# Patient Record
Sex: Female | Born: 1980 | Race: Black or African American | Hispanic: No | Smoking: Never smoker
Health system: Southern US, Community
[De-identification: ages and names within clinical notes are randomized; demographics above are authoritative.]

## PROBLEM LIST (undated history)

## (undated) DIAGNOSIS — B019 Varicella without complication: Secondary | ICD-10-CM

## (undated) DIAGNOSIS — O139 Gestational [pregnancy-induced] hypertension without significant proteinuria, unspecified trimester: Secondary | ICD-10-CM

## (undated) DIAGNOSIS — D219 Benign neoplasm of connective and other soft tissue, unspecified: Secondary | ICD-10-CM

## (undated) DIAGNOSIS — R51 Headache: Secondary | ICD-10-CM

## (undated) DIAGNOSIS — E781 Pure hyperglyceridemia: Secondary | ICD-10-CM

## (undated) HISTORY — DX: Benign neoplasm of connective and other soft tissue, unspecified: D21.9

## (undated) HISTORY — DX: Varicella without complication: B01.9

## (undated) HISTORY — DX: Gestational (pregnancy-induced) hypertension without significant proteinuria, unspecified trimester: O13.9

## (undated) HISTORY — DX: Headache: R51

## (undated) HISTORY — DX: Pure hyperglyceridemia: E78.1

---

## 2000-05-24 ENCOUNTER — Encounter: Payer: Self-pay | Admitting: Emergency Medicine

## 2000-05-24 ENCOUNTER — Emergency Department (HOSPITAL_COMMUNITY): Admission: EM | Admit: 2000-05-24 | Discharge: 2000-05-24 | Payer: Self-pay | Admitting: Emergency Medicine

## 2003-06-01 ENCOUNTER — Emergency Department (HOSPITAL_COMMUNITY): Admission: EM | Admit: 2003-06-01 | Discharge: 2003-06-01 | Payer: Self-pay | Admitting: Emergency Medicine

## 2004-06-27 ENCOUNTER — Other Ambulatory Visit: Admission: RE | Admit: 2004-06-27 | Discharge: 2004-06-27 | Payer: Self-pay | Admitting: Obstetrics and Gynecology

## 2005-06-02 ENCOUNTER — Emergency Department (HOSPITAL_COMMUNITY): Admission: EM | Admit: 2005-06-02 | Discharge: 2005-06-02 | Payer: Self-pay | Admitting: Family Medicine

## 2005-06-24 ENCOUNTER — Encounter: Admission: RE | Admit: 2005-06-24 | Discharge: 2005-06-24 | Payer: Self-pay | Admitting: *Deleted

## 2007-02-11 ENCOUNTER — Encounter: Admission: RE | Admit: 2007-02-11 | Discharge: 2007-02-11 | Payer: Self-pay | Admitting: Chiropractic Medicine

## 2009-07-11 ENCOUNTER — Emergency Department (HOSPITAL_COMMUNITY): Admission: EM | Admit: 2009-07-11 | Discharge: 2009-07-11 | Payer: Self-pay | Admitting: Family Medicine

## 2010-07-22 LAB — POCT I-STAT, CHEM 8
BUN: 6 mg/dL (ref 6–23)
Calcium, Ion: 1.12 mmol/L (ref 1.12–1.32)
Chloride: 104 mEq/L (ref 96–112)
Creatinine, Ser: 0.6 mg/dL (ref 0.4–1.2)
Glucose, Bld: 91 mg/dL (ref 70–99)
HCT: 39 % (ref 36.0–46.0)
Hemoglobin: 13.3 g/dL (ref 12.0–15.0)
Potassium: 3.3 mEq/L — ABNORMAL LOW (ref 3.5–5.1)
Sodium: 139 mEq/L (ref 135–145)
TCO2: 27 mmol/L (ref 0–100)

## 2010-07-22 LAB — POCT PREGNANCY, URINE: Preg Test, Ur: NEGATIVE

## 2010-08-12 LAB — HEPATITIS B SURFACE ANTIGEN: Hepatitis B Surface Ag: NEGATIVE

## 2010-08-12 LAB — GC/CHLAMYDIA PROBE AMP, GENITAL
Chlamydia: NEGATIVE
Gonorrhea: NEGATIVE

## 2010-08-12 LAB — HIV ANTIBODY (ROUTINE TESTING W REFLEX): HIV: NONREACTIVE

## 2010-08-12 LAB — ANTIBODY SCREEN: Antibody Screen: NEGATIVE

## 2010-08-12 LAB — ABO/RH: RH Type: POSITIVE

## 2010-08-12 LAB — RUBELLA ANTIBODY, IGM: Rubella: IMMUNE

## 2010-08-12 LAB — RPR: RPR: NONREACTIVE

## 2011-02-27 LAB — STREP B DNA PROBE: GBS: NEGATIVE

## 2011-03-07 ENCOUNTER — Telehealth (HOSPITAL_COMMUNITY): Payer: Self-pay | Admitting: *Deleted

## 2011-03-07 ENCOUNTER — Encounter (HOSPITAL_COMMUNITY): Payer: Self-pay | Admitting: *Deleted

## 2011-03-07 NOTE — Telephone Encounter (Signed)
Preadmission screen  

## 2011-03-13 ENCOUNTER — Ambulatory Visit (HOSPITAL_COMMUNITY): Payer: BC Managed Care – PPO | Admitting: Anesthesiology

## 2011-03-13 ENCOUNTER — Other Ambulatory Visit: Payer: Self-pay | Admitting: Obstetrics and Gynecology

## 2011-03-13 ENCOUNTER — Encounter (HOSPITAL_COMMUNITY): Payer: Self-pay

## 2011-03-13 ENCOUNTER — Encounter (HOSPITAL_COMMUNITY)
Admission: RE | Disposition: A | Discharge status: Home / Self Care | Payer: Self-pay | Source: Ambulatory Visit | Attending: Obstetrics and Gynecology

## 2011-03-13 ENCOUNTER — Encounter (HOSPITAL_COMMUNITY): Payer: Self-pay | Admitting: Anesthesiology

## 2011-03-13 ENCOUNTER — Inpatient Hospital Stay (HOSPITAL_COMMUNITY)
Admission: RE | Admit: 2011-03-13 | Discharge: 2011-03-15 | DRG: 651 | Disposition: A | Discharge status: Home / Self Care | Payer: BC Managed Care – PPO | Source: Ambulatory Visit | Attending: Obstetrics and Gynecology | Admitting: Obstetrics and Gynecology

## 2011-03-13 DIAGNOSIS — O139 Gestational [pregnancy-induced] hypertension without significant proteinuria, unspecified trimester: Principal | ICD-10-CM | POA: Diagnosis present

## 2011-03-13 LAB — CBC
HCT: 30.3 % — ABNORMAL LOW (ref 36.0–46.0)
HCT: 31.1 % — ABNORMAL LOW (ref 36.0–46.0)
Hemoglobin: 9.9 g/dL — ABNORMAL LOW (ref 12.0–15.0)
Hemoglobin: 10.2 g/dL — ABNORMAL LOW (ref 12.0–15.0)
MCH: 30.1 pg (ref 26.0–34.0)
MCH: 30 pg (ref 26.0–34.0)
MCHC: 32.7 g/dL (ref 30.0–36.0)
MCHC: 32.8 g/dL (ref 30.0–36.0)
MCV: 92.1 fL (ref 78.0–100.0)
MCV: 91.5 fL (ref 78.0–100.0)
Platelets: 298 10*3/uL (ref 150–400)
Platelets: 274 10*3/uL (ref 150–400)
RBC: 3.29 MIL/uL — ABNORMAL LOW (ref 3.87–5.11)
RBC: 3.4 MIL/uL — ABNORMAL LOW (ref 3.87–5.11)
RDW: 14.2 % (ref 11.5–15.5)
RDW: 14.1 % (ref 11.5–15.5)
WBC: 9.6 10*3/uL (ref 4.0–10.5)
WBC: 13.9 10*3/uL — ABNORMAL HIGH (ref 4.0–10.5)

## 2011-03-13 LAB — COMPREHENSIVE METABOLIC PANEL
ALT: 10 U/L (ref 0–35)
AST: 15 U/L (ref 0–37)
Albumin: 2.6 g/dL — ABNORMAL LOW (ref 3.5–5.2)
Alkaline Phosphatase: 132 U/L — ABNORMAL HIGH (ref 39–117)
BUN: 7 mg/dL (ref 6–23)
CO2: 23 mEq/L (ref 19–32)
Calcium: 9.7 mg/dL (ref 8.4–10.5)
Chloride: 102 mEq/L (ref 96–112)
Creatinine, Ser: 0.52 mg/dL (ref 0.50–1.10)
GFR calc Af Amer: >90 mL/min (ref >90)
GFR calc non Af Amer: >90 mL/min (ref >90)
Glucose, Bld: 110 mg/dL — ABNORMAL HIGH (ref 70–99)
Potassium: 4 mEq/L (ref 3.5–5.1)
Sodium: 136 mEq/L (ref 135–145)
Total Bilirubin: 0.1 mg/dL — ABNORMAL LOW (ref 0.3–1.2)
Total Protein: 6.3 g/dL (ref 6.0–8.3)

## 2011-03-13 LAB — URIC ACID: Uric Acid, Serum: 4.7 mg/dL (ref 2.4–7.0)

## 2011-03-13 SURGERY — Surgical Case
Anesthesia: Epidural | Site: Abdomen | Wound class: Clean Contaminated

## 2011-03-13 MED ORDER — LACTATED RINGERS IV SOLN: 500.0000 mL | INTRAVENOUS | Status: DC | PRN

## 2011-03-13 MED ORDER — EPHEDRINE 5 MG/ML INJ: 10.0000 mg | INTRAVENOUS | Status: DC | PRN

## 2011-03-13 MED ORDER — CEFAZOLIN SODIUM 1-5 GM-% IV SOLN
INTRAVENOUS | Status: DC | PRN
  Administered 2011-03-13: 2 g via INTRAVENOUS

## 2011-03-13 MED ORDER — HYDROMORPHONE HCL PF 1 MG/ML IJ SOLN: 0.2500 mg | INTRAMUSCULAR | Status: DC | PRN

## 2011-03-13 MED ORDER — OXYTOCIN 20 UNITS IN LACTATED RINGERS INFUSION - SIMPLE
1.0000 m[IU]/min | INTRAVENOUS | Status: DC
  Administered 2011-03-13: 1 m[IU]/min via INTRAVENOUS
  Administered 2011-03-13: 5 m[IU]/min via INTRAVENOUS

## 2011-03-13 MED ORDER — NALBUPHINE SYRINGE 5 MG/0.5 ML
5.0000 mg | INJECTION | INTRAMUSCULAR | Status: DC | PRN
  Filled 2011-03-13: qty 1

## 2011-03-13 MED ORDER — ONDANSETRON HCL 4 MG/2ML IJ SOLN: 4.0000 mg | Freq: Four times a day (QID) | INTRAMUSCULAR | Status: DC | PRN

## 2011-03-13 MED ORDER — EPHEDRINE SULFATE 50 MG/ML IJ SOLN
INTRAMUSCULAR | Status: DC | PRN
  Administered 2011-03-13 (×2): 10 mg via INTRAVENOUS

## 2011-03-13 MED ORDER — EPHEDRINE 5 MG/ML INJ
10.0000 mg | INTRAVENOUS | Status: DC | PRN
  Filled 2011-03-13: qty 4

## 2011-03-13 MED ORDER — OXYTOCIN BOLUS FROM INFUSION
500.0000 mL | Freq: Once | INTRAVENOUS | Status: DC
  Filled 2011-03-13: qty 500

## 2011-03-13 MED ORDER — CEFAZOLIN SODIUM 1-5 GM-% IV SOLN
1.0000 g | Freq: Three times a day (TID) | INTRAVENOUS | Status: AC
  Filled 2011-03-13: qty 50

## 2011-03-13 MED ORDER — TERBUTALINE SULFATE 1 MG/ML IJ SOLN: 0.2500 mg | Freq: Once | INTRAMUSCULAR | Status: DC | PRN

## 2011-03-13 MED ORDER — OXYCODONE-ACETAMINOPHEN 5-325 MG PO TABS: 2.0000 | ORAL_TABLET | ORAL | Status: DC | PRN

## 2011-03-13 MED ORDER — FLEET ENEMA 7-19 GM/118ML RE ENEM: 1.0000 | ENEMA | RECTAL | Status: DC | PRN

## 2011-03-13 MED ORDER — LACTATED RINGERS IV SOLN
500.0000 mL | Freq: Once | INTRAVENOUS | Status: AC
  Administered 2011-03-13: 500 mL via INTRAVENOUS
  Administered 2011-03-13 (×2): via INTRAVENOUS

## 2011-03-13 MED ORDER — OXYTOCIN 20 UNITS IN LACTATED RINGERS INFUSION - SIMPLE
125.0000 mL/h | Freq: Once | INTRAVENOUS | Status: DC
  Filled 2011-03-13: qty 1000

## 2011-03-13 MED ORDER — DIPHENHYDRAMINE HCL 50 MG/ML IJ SOLN: 12.5000 mg | INTRAMUSCULAR | Status: DC | PRN

## 2011-03-13 MED ORDER — KETOROLAC TROMETHAMINE 30 MG/ML IJ SOLN: 15.0000 mg | Freq: Once | INTRAMUSCULAR | Status: AC | PRN

## 2011-03-13 MED ORDER — LACTATED RINGERS IV SOLN
INTRAVENOUS | Status: DC
  Administered 2011-03-13 (×2): via INTRAVENOUS

## 2011-03-13 MED ORDER — ONDANSETRON HCL 4 MG/2ML IJ SOLN
INTRAMUSCULAR | Status: DC | PRN
  Administered 2011-03-13: 4 mg via INTRAVENOUS

## 2011-03-13 MED ORDER — LACTATED RINGERS IV SOLN: 500.0000 mL | Freq: Once | INTRAVENOUS | Status: DC

## 2011-03-13 MED ORDER — ONDANSETRON HCL 4 MG/2ML IJ SOLN: 4.0000 mg | Freq: Three times a day (TID) | INTRAMUSCULAR | Status: DC | PRN

## 2011-03-13 MED ORDER — PHENYLEPHRINE 40 MCG/ML (10ML) SYRINGE FOR IV PUSH (FOR BLOOD PRESSURE SUPPORT): 80.0000 ug | PREFILLED_SYRINGE | INTRAVENOUS | Status: DC | PRN

## 2011-03-13 MED ORDER — BUTORPHANOL TARTRATE 2 MG/ML IJ SOLN
INTRAMUSCULAR | Status: AC
  Administered 2011-03-13: 2 mg via INTRAVENOUS
  Filled 2011-03-13: qty 1

## 2011-03-13 MED ORDER — FENTANYL 2.5 MCG/ML BUPIVACAINE 1/10 % EPIDURAL INFUSION (WH - ANES)
INTRAMUSCULAR | Status: DC | PRN
  Administered 2011-03-13: 14 mL/h via EPIDURAL

## 2011-03-13 MED ORDER — PROMETHAZINE HCL 25 MG/ML IJ SOLN: 6.2500 mg | INTRAMUSCULAR | Status: DC | PRN

## 2011-03-13 MED ORDER — PHENYLEPHRINE 40 MCG/ML (10ML) SYRINGE FOR IV PUSH (FOR BLOOD PRESSURE SUPPORT)
80.0000 ug | PREFILLED_SYRINGE | INTRAVENOUS | Status: DC | PRN
Start: 2011-03-13 — End: 2011-03-13

## 2011-03-13 MED ORDER — LIDOCAINE HCL (PF) 1 % IJ SOLN: 30.0000 mL | INTRAMUSCULAR | Status: DC | PRN

## 2011-03-13 MED ORDER — MEPERIDINE HCL 25 MG/ML IJ SOLN: 6.2500 mg | INTRAMUSCULAR | Status: DC | PRN

## 2011-03-13 MED ORDER — MORPHINE SULFATE (PF) 0.5 MG/ML IJ SOLN
INTRAMUSCULAR | Status: DC | PRN
  Administered 2011-03-13: 2 mg via INTRAVENOUS

## 2011-03-13 MED ORDER — OXYTOCIN 20 UNITS IN LACTATED RINGERS INFUSION - SIMPLE
INTRAVENOUS | Status: DC | PRN
  Administered 2011-03-13: 20 [IU] via INTRAVENOUS

## 2011-03-13 MED ORDER — BUTORPHANOL TARTRATE 2 MG/ML IJ SOLN
1.0000 mg | INTRAMUSCULAR | Status: DC | PRN
  Administered 2011-03-13: 2 mg via INTRAVENOUS
  Filled 2011-03-13: qty 1

## 2011-03-13 MED ORDER — CITRIC ACID-SODIUM CITRATE 334-500 MG/5ML PO SOLN
30.0000 mL | ORAL | Status: DC | PRN
  Administered 2011-03-13: 30 mL via ORAL
  Filled 2011-03-13: qty 15

## 2011-03-13 MED ORDER — ACETAMINOPHEN 325 MG PO TABS: 650.0000 mg | ORAL_TABLET | ORAL | Status: DC | PRN

## 2011-03-13 MED ORDER — SCOPOLAMINE 1 MG/3DAYS TD PT72
1.0000 | MEDICATED_PATCH | Freq: Once | TRANSDERMAL | Status: DC
  Administered 2011-03-14: 1.5 mg via TRANSDERMAL

## 2011-03-13 MED ORDER — LIDOCAINE HCL 1.5 % IJ SOLN
INTRAMUSCULAR | Status: DC | PRN
  Administered 2011-03-13 (×2): 5 mL via EPIDURAL

## 2011-03-13 MED ORDER — FENTANYL 2.5 MCG/ML BUPIVACAINE 1/10 % EPIDURAL INFUSION (WH - ANES)
14.0000 mL/h | INTRAMUSCULAR | Status: DC
  Filled 2011-03-13: qty 60

## 2011-03-13 MED ORDER — PHENYLEPHRINE 40 MCG/ML (10ML) SYRINGE FOR IV PUSH (FOR BLOOD PRESSURE SUPPORT)
80.0000 ug | PREFILLED_SYRINGE | INTRAVENOUS | Status: DC | PRN
  Filled 2011-03-13: qty 5

## 2011-03-13 MED ORDER — IBUPROFEN 600 MG PO TABS: 600.0000 mg | ORAL_TABLET | Freq: Four times a day (QID) | ORAL | Status: DC | PRN

## 2011-03-13 MED ORDER — FENTANYL 2.5 MCG/ML BUPIVACAINE 1/10 % EPIDURAL INFUSION (WH - ANES): 14.0000 mL/h | INTRAMUSCULAR | Status: DC

## 2011-03-13 MED ORDER — CEFAZOLIN SODIUM-DEXTROSE 2-3 GM-% IV SOLR
2.0000 g | Freq: Once | INTRAVENOUS | Status: DC
  Filled 2011-03-13: qty 50

## 2011-03-13 MED ORDER — MORPHINE SULFATE (PF) 0.5 MG/ML IJ SOLN
INTRAMUSCULAR | Status: DC | PRN
  Administered 2011-03-13: 3 mg via EPIDURAL

## 2011-03-13 MED ORDER — SODIUM BICARBONATE 8.4 % IV SOLN
INTRAVENOUS | Status: DC | PRN
  Administered 2011-03-13: 10 mL via EPIDURAL

## 2011-03-13 SURGICAL SUPPLY — 34 items
APL SKNCLS STERI-STRIP NONHPOA (GAUZE/BANDAGES/DRESSINGS) ×1
BENZOIN TINCTURE PRP APPL 2/3 (GAUZE/BANDAGES/DRESSINGS) ×1 IMPLANT
CLOSURE STERI STRIP 1/2 X4 (GAUZE/BANDAGES/DRESSINGS) ×1 IMPLANT
CLOTH BEACON ORANGE TIMEOUT ST (SAFETY) ×2 IMPLANT
CONTAINER PREFILL 10% NBF 15ML (MISCELLANEOUS) IMPLANT
DRESSING TELFA 8X3 (GAUZE/BANDAGES/DRESSINGS) ×1 IMPLANT
DRSG COVADERM 4X8 (GAUZE/BANDAGES/DRESSINGS) ×1 IMPLANT
DRSG PAD ABDOMINAL 8X10 ST (GAUZE/BANDAGES/DRESSINGS) ×1 IMPLANT
ELECT REM PT RETURN 9FT ADLT (ELECTROSURGICAL) ×2
ELECTRODE REM PT RTRN 9FT ADLT (ELECTROSURGICAL) ×1 IMPLANT
EXTRACTOR VACUUM M CUP 4 TUBE (SUCTIONS) IMPLANT
GAUZE SPONGE 4X4 12PLY STRL LF (GAUZE/BANDAGES/DRESSINGS) ×4 IMPLANT
GLOVE BIO SURGEON STRL SZ8 (GLOVE) ×4 IMPLANT
GOWN PREVENTION PLUS LG XLONG (DISPOSABLE) ×2 IMPLANT
KIT ABG SYR 3ML LUER SLIP (SYRINGE) ×2 IMPLANT
NDL HYPO 25X5/8 SAFETYGLIDE (NEEDLE) ×1 IMPLANT
NEEDLE HYPO 25X5/8 SAFETYGLIDE (NEEDLE) ×2 IMPLANT
NS IRRIG 1000ML POUR BTL (IV SOLUTION) ×2 IMPLANT
PACK C SECTION WH (CUSTOM PROCEDURE TRAY) ×2 IMPLANT
PAD ABD 7.5X8 STRL (GAUZE/BANDAGES/DRESSINGS) IMPLANT
SLEEVE SCD COMPRESS KNEE MED (MISCELLANEOUS) ×1 IMPLANT
SPONGE GAUZE 4X4 12PLY (GAUZE/BANDAGES/DRESSINGS) ×1 IMPLANT
STAPLER VISISTAT 35W (STAPLE) IMPLANT
STRIP CLOSURE SKIN 1/4X4 (GAUZE/BANDAGES/DRESSINGS) ×2 IMPLANT
SUT MNCRL 0 VIOLET CTX 36 (SUTURE) ×4 IMPLANT
SUT MONOCRYL 0 CTX 36 (SUTURE) ×4
SUT PDS AB 0 CTX 60 (SUTURE) ×2 IMPLANT
SUT PLAIN 0 NONE (SUTURE) IMPLANT
SUT PLAIN 2 0 XLH (SUTURE) ×1 IMPLANT
SUT VIC AB 4-0 KS 27 (SUTURE) ×1 IMPLANT
TAPE CLOTH SURG 4X10 WHT LF (GAUZE/BANDAGES/DRESSINGS) ×1 IMPLANT
TOWEL OR 17X24 6PK STRL BLUE (TOWEL DISPOSABLE) ×4 IMPLANT
TRAY FOLEY CATH 14FR (SET/KITS/TRAYS/PACK) ×1 IMPLANT
WATER STERILE IRR 1000ML POUR (IV SOLUTION) ×1 IMPLANT

## 2011-03-13 NOTE — Progress Notes (Signed)
FHT reactive Cx 3-4/C/-3 IUPC placed UCs about q44min Pit on Check CBC now for anesthesia in case pt wants epidural

## 2011-03-13 NOTE — Anesthesia Preprocedure Evaluation (Signed)
Anesthesia Evaluation  Patient identified by MRN, date of birth, ID band Patient awake    Reviewed: Allergy & Precautions, H&P , NPO status , Patient's Chart, lab work & pertinent test results  Airway Mallampati: II TM Distance: >3 FB Neck ROM: full    Dental No notable dental hx.    Pulmonary neg pulmonary ROS,    Pulmonary exam normal       Cardiovascular hypertension,     Neuro/Psych Negative Psych ROS   GI/Hepatic negative GI ROS, Neg liver ROS,   Endo/Other  Morbid obesity  Renal/GU negative Renal ROS  Genitourinary negative   Musculoskeletal negative musculoskeletal ROS (+)   Abdominal (+) obese,   Peds negative pediatric ROS (+)  Hematology negative hematology ROS (+)   Anesthesia Other Findings   Reproductive/Obstetrics (+) Pregnancy                           Anesthesia Physical Anesthesia Plan  ASA: III  Anesthesia Plan: Epidural   Post-op Pain Management:    Induction:   Airway Management Planned:   Additional Equipment:   Intra-op Plan:   Post-operative Plan:   Informed Consent: I have reviewed the patients History and Physical, chart, labs and discussed the procedure including the risks, benefits and alternatives for the proposed anesthesia with the patient or authorized representative who has indicated his/her understanding and acceptance.     Plan Discussed with:   Anesthesia Plan Comments:         Anesthesia Quick Evaluation

## 2011-03-13 NOTE — Plan of Care (Signed)
Problem: Consults Goal: Birthing Suites Patient Information Press F2 to bring up selections list   Pt 37-[redacted] weeks EGA     

## 2011-03-13 NOTE — H&P (Signed)
Natalie Kane is a 30 y.o. female presenting for induction of labor.  Pregnancy complicated by worsening BPs in office, increasingly labile, 140-160/80-90s. Also, headache getting progressively worse with only partial relief with meds. No vision change or epigastric pain. No ROM/bleeding. Maternal Medical History:  Fetal activity: Perceived fetal activity is normal.    Prenatal complications: Hypertension.     OB History    Grav Para Term Preterm Abortions TAB SAB Ect Mult Living   1              Past Medical History  Diagnosis Date  . Fibroids     microscopic  . Pregnancy induced hypertension   . Headache    No past surgical history on file. Family History: family history includes Cancer in her father and paternal grandmother; Heart disease in her father; Hypertension in her father; and Stroke in her maternal grandmother.  There is no history of Anesthesia problems, and Hypotension, and Malignant hyperthermia, and Pseudochol deficiency, . Social History:  reports that she has never smoked. She has never used smokeless tobacco. She reports that she does not drink alcohol or use illicit drugs.  Review of Systems  Eyes: Negative for blurred vision.  Respiratory: Negative for shortness of breath.   Cardiovascular: Negative for chest pain.  Gastrointestinal: Negative for abdominal pain.  Neurological: Positive for headaches.    Dilation: 3 Effacement (%): 90 Station: -2 Exam by:: Dr Henderson Cloud Blood pressure 155/86, pulse 105, temperature 98 F (36.7 C), temperature source Oral, resp. rate 20, height 6' (1.829 m), weight 122.018 kg (269 lb), last menstrual period 06/13/2010. Maternal Exam:  Uterine Assessment: Contraction strength is mild.  Contraction frequency is irregular.      Fetal Exam Fetal Monitor Review: Pattern: accelerations present.       Physical Exam  Constitutional: She appears well-developed and well-nourished.  Cardiovascular: Normal rate and regular  rhythm.   Respiratory: Effort normal and breath sounds normal.  GI: There is no tenderness.  Neurological: She has normal reflexes.   AROM-Clear Prenatal labs: ABO, Rh: O/Positive/-- (04/16 0000) Antibody: Negative (04/16 0000) Rubella: Immune (04/16 0000) RPR: Nonreactive (04/16 0000)  HBsAg: Negative (04/16 0000)  HIV: Non-reactive (04/16 0000)  GBS: Negative (11/01 0000)   Assessment/Plan: 30 yo G1P0 with PIH and HA. Will check PIH labs D/W pt induction-will begin pitocin Epidural prn   Natalie Kane,Natalie Kane 03/13/2011, 8:11 AM

## 2011-03-13 NOTE — Transfer of Care (Signed)
Immediate Anesthesia Transfer of Care Note  Patient: Natalie Kane  Procedure(s) Performed:  CESAREAN SECTION  Patient Location: PACU  Anesthesia Type: Epidural  Level of Consciousness: awake, alert  and oriented  Airway & Oxygen Therapy: Patient Spontanous Breathing  Post-op Assessment: Report given to PACU RN and Post -op Vital signs reviewed and stable  Post vital signs: Reviewed and stable  Complications: No apparent anesthesia complications

## 2011-03-13 NOTE — Anesthesia Procedure Notes (Signed)
Epidural Patient location during procedure: OB Start time: 03/13/2011 9:25 PM End time: 03/13/2011 9:38 PM Reason for block: procedure for pain  Staffing Anesthesiologist: Sandrea Hughs Performed by: anesthesiologist   Preanesthetic Checklist Completed: patient identified, site marked, surgical consent, pre-op evaluation, timeout performed, IV checked, risks and benefits discussed and monitors and equipment checked  Epidural Patient position: sitting Prep: site prepped and draped and DuraPrep Patient monitoring: continuous pulse ox and blood pressure Approach: midline Injection technique: LOR air  Needle:  Needle type: Tuohy  Needle gauge: 17 G Needle length: 9 cm Needle insertion depth: 9 cm Catheter type: closed end flexible Catheter size: 19 Gauge Catheter at skin depth: 15 cm Test dose: negative and 1.5% lidocaine  Assessment Sensory level: T10 Events: blood not aspirated, injection not painful, no injection resistance, negative IV test and no paresthesia

## 2011-03-13 NOTE — Brief Op Note (Signed)
03/13/2011  11:33 PM  PATIENT:  Natalie Kane  30 y.o. female G1 P0 admitted for induction of labor for PIH  PRE-OPERATIVE DIAGNOSIS:  Arrest of Dilation  POST-OPERATIVE DIAGNOSIS:  Arrest of Dilation  PROCEDURE:  Procedure(s): CESAREAN SECTION  SURGEON:  Surgeon(s): Roselle Locus II  PHYSICIAN ASSISTANT:   ASSISTANTS: none   ANESTHESIA:   epidural  EBL:  Total I/O In: 600 [I.V.:600] Out: 1750 [Urine:850; Emesis/NG output:400; Blood:500]  BLOOD ADMINISTERED:none  DRAINS: Urinary Catheter (Foley)   LOCAL MEDICATIONS USED:  NONE  SPECIMEN:  Source of Specimen:  placenta  DISPOSITION OF SPECIMEN:  PATHOLOGY  COUNTS:  YES  TOURNIQUET:  * No tourniquets in log *  DICTATION: .Other Dictation: Dictation Number [id number]  PLAN OF CARE: Admit to inpatient   PATIENT DISPOSITION:  PACU - hemodynamically stable.   Delay start of Pharmacological VTE agent (>24hrs) due to surgical blood loss or risk of bleeding:  {YES/NO/NOT APPLICABLE:20182

## 2011-03-13 NOTE — Consult Note (Signed)
Neonatology Note:   Attendance at C-section:    I was asked to attend this primary C/S at term due to failure of descent. The mother is a G1P0 O pos, GBS neg with PIH and fibroids. ROM about 15 hours prior to delivery, fluid clear. Infant vigorous with good spontaneous cry and tone. Needed only minimal bulb suctioning. Ap 8/9. Lungs clear to ausc in DR. Left in OR for skin to skin under supervision of OB nurse, then will go to CN to care of Pediatrician.   Deatra James, MD

## 2011-03-13 NOTE — Progress Notes (Signed)
Cx 3-4/C/caput/-3 FHT  decel to 80s x 2-3 min corrected with position change and O2, now + accels, 130s UCs MVU adequate for 3-4 hours  A: Arrest of Dilation  P: Rec: cesarean section     D/W pt above, risks-infection, bleeding/transfusion-HIV/Hep, DVT/PE, pneumonia, organ damage.  All questions answered.

## 2011-03-14 ENCOUNTER — Encounter (HOSPITAL_COMMUNITY): Payer: Self-pay

## 2011-03-14 LAB — CBC
HCT: 29.3 % — ABNORMAL LOW (ref 36.0–46.0)
HCT: 29.6 % — ABNORMAL LOW (ref 36.0–46.0)
Hemoglobin: 9.4 g/dL — ABNORMAL LOW (ref 12.0–15.0)
Hemoglobin: 9.4 g/dL — ABNORMAL LOW (ref 12.0–15.0)
MCH: 29.6 pg (ref 26.0–34.0)
MCH: 29.9 pg (ref 26.0–34.0)
MCHC: 32.1 g/dL (ref 30.0–36.0)
MCHC: 31.8 g/dL (ref 30.0–36.0)
MCV: 92.1 fL (ref 78.0–100.0)
MCV: 94.3 fL (ref 78.0–100.0)
Platelets: 261 10*3/uL (ref 150–400)
Platelets: 255 10*3/uL (ref 150–400)
RBC: 3.18 MIL/uL — ABNORMAL LOW (ref 3.87–5.11)
RBC: 3.14 MIL/uL — ABNORMAL LOW (ref 3.87–5.11)
RDW: 14.1 % (ref 11.5–15.5)
RDW: 14.5 % (ref 11.5–15.5)
WBC: 15.9 10*3/uL — ABNORMAL HIGH (ref 4.0–10.5)
WBC: 15.8 10*3/uL — ABNORMAL HIGH (ref 4.0–10.5)

## 2011-03-14 MED ORDER — OXYCODONE-ACETAMINOPHEN 5-325 MG PO TABS
1.0000 | ORAL_TABLET | ORAL | Status: DC | PRN
  Administered 2011-03-14: 1 via ORAL
  Administered 2011-03-15: 2 via ORAL
  Filled 2011-03-14: qty 1
  Filled 2011-03-14: qty 2

## 2011-03-14 MED ORDER — DIPHENHYDRAMINE HCL 50 MG/ML IJ SOLN: 25.0000 mg | INTRAMUSCULAR | Status: DC | PRN

## 2011-03-14 MED ORDER — KETOROLAC TROMETHAMINE 30 MG/ML IJ SOLN
30.0000 mg | Freq: Four times a day (QID) | INTRAMUSCULAR | Status: AC | PRN
  Administered 2011-03-14 (×2): 30 mg via INTRAVENOUS
  Filled 2011-03-14: qty 1

## 2011-03-14 MED ORDER — IBUPROFEN 600 MG PO TABS: 600.0000 mg | ORAL_TABLET | Freq: Four times a day (QID) | ORAL | Status: DC | PRN

## 2011-03-14 MED ORDER — SIMETHICONE 80 MG PO CHEW: 80.0000 mg | CHEWABLE_TABLET | ORAL | Status: DC | PRN

## 2011-03-14 MED ORDER — OXYTOCIN 20 UNITS IN LACTATED RINGERS INFUSION - SIMPLE
125.0000 mL/h | INTRAVENOUS | Status: AC
  Administered 2011-03-14: 125 mL/h via INTRAVENOUS

## 2011-03-14 MED ORDER — MENTHOL 3 MG MT LOZG: 1.0000 | LOZENGE | OROMUCOSAL | Status: DC | PRN

## 2011-03-14 MED ORDER — ONDANSETRON HCL 4 MG PO TABS: 4.0000 mg | ORAL_TABLET | ORAL | Status: DC | PRN

## 2011-03-14 MED ORDER — DIPHENHYDRAMINE HCL 50 MG/ML IJ SOLN: 12.5000 mg | INTRAMUSCULAR | Status: DC | PRN

## 2011-03-14 MED ORDER — NALOXONE HCL 0.4 MG/ML IJ SOLN
1.0000 ug/kg/h | INTRAMUSCULAR | Status: DC | PRN
  Filled 2011-03-14: qty 2.5

## 2011-03-14 MED ORDER — LACTATED RINGERS IV SOLN
INTRAVENOUS | Status: DC
  Administered 2011-03-14: 16:00:00 via INTRAVENOUS

## 2011-03-14 MED ORDER — DIPHENHYDRAMINE HCL 25 MG PO CAPS
25.0000 mg | ORAL_CAPSULE | Freq: Four times a day (QID) | ORAL | Status: DC | PRN
  Filled 2011-03-14: qty 1

## 2011-03-14 MED ORDER — ZOLPIDEM TARTRATE 5 MG PO TABS: 5.0000 mg | ORAL_TABLET | Freq: Every evening | ORAL | Status: DC | PRN

## 2011-03-14 MED ORDER — SODIUM CHLORIDE 0.9 % IJ SOLN: 3.0000 mL | INTRAMUSCULAR | Status: DC | PRN

## 2011-03-14 MED ORDER — LANOLIN HYDROUS EX OINT: 1.0000 "application " | TOPICAL_OINTMENT | CUTANEOUS | Status: DC | PRN

## 2011-03-14 MED ORDER — NALOXONE HCL 0.4 MG/ML IJ SOLN: 0.4000 mg | INTRAMUSCULAR | Status: DC | PRN

## 2011-03-14 MED ORDER — PRENATAL PLUS 27-1 MG PO TABS
1.0000 | ORAL_TABLET | Freq: Every day | ORAL | Status: DC
  Administered 2011-03-14 – 2011-03-15 (×2): 1 via ORAL
  Filled 2011-03-14: qty 1

## 2011-03-14 MED ORDER — WITCH HAZEL-GLYCERIN EX PADS: 1.0000 "application " | MEDICATED_PAD | CUTANEOUS | Status: DC | PRN

## 2011-03-14 MED ORDER — IBUPROFEN 600 MG PO TABS
600.0000 mg | ORAL_TABLET | Freq: Four times a day (QID) | ORAL | Status: DC
  Administered 2011-03-14 – 2011-03-15 (×4): 600 mg via ORAL
  Filled 2011-03-14 (×4): qty 1

## 2011-03-14 MED ORDER — KETOROLAC TROMETHAMINE 30 MG/ML IJ SOLN
INTRAMUSCULAR | Status: AC
  Administered 2011-03-14: 30 mg via INTRAVENOUS
  Filled 2011-03-14: qty 1

## 2011-03-14 MED ORDER — SIMETHICONE 80 MG PO CHEW
80.0000 mg | CHEWABLE_TABLET | Freq: Three times a day (TID) | ORAL | Status: DC
  Administered 2011-03-14 – 2011-03-15 (×5): 80 mg via ORAL

## 2011-03-14 MED ORDER — KETOROLAC TROMETHAMINE 30 MG/ML IJ SOLN: 30.0000 mg | Freq: Four times a day (QID) | INTRAMUSCULAR | Status: AC | PRN

## 2011-03-14 MED ORDER — DIPHENHYDRAMINE HCL 25 MG PO CAPS
25.0000 mg | ORAL_CAPSULE | ORAL | Status: DC | PRN
  Administered 2011-03-14: 25 mg via ORAL

## 2011-03-14 MED ORDER — ONDANSETRON HCL 4 MG/2ML IJ SOLN: 4.0000 mg | INTRAMUSCULAR | Status: DC | PRN

## 2011-03-14 MED ORDER — SCOPOLAMINE 1 MG/3DAYS TD PT72
MEDICATED_PATCH | TRANSDERMAL | Status: AC
  Administered 2011-03-14: 1.5 mg via TRANSDERMAL
  Filled 2011-03-14: qty 1

## 2011-03-14 MED ORDER — MEPERIDINE HCL 25 MG/ML IJ SOLN: 6.2500 mg | INTRAMUSCULAR | Status: DC | PRN

## 2011-03-14 MED ORDER — TETANUS-DIPHTH-ACELL PERTUSSIS 5-2.5-18.5 LF-MCG/0.5 IM SUSP: 0.5000 mL | Freq: Once | INTRAMUSCULAR | Status: DC

## 2011-03-14 MED ORDER — SENNOSIDES-DOCUSATE SODIUM 8.6-50 MG PO TABS
2.0000 | ORAL_TABLET | Freq: Every day | ORAL | Status: DC
  Administered 2011-03-14: 2 via ORAL

## 2011-03-14 MED ORDER — KETOROLAC TROMETHAMINE 60 MG/2ML IM SOLN: 60.0000 mg | Freq: Once | INTRAMUSCULAR | Status: DC | PRN

## 2011-03-14 MED ORDER — DIBUCAINE 1 % RE OINT: 1.0000 "application " | TOPICAL_OINTMENT | RECTAL | Status: DC | PRN

## 2011-03-14 NOTE — Progress Notes (Signed)
UR Chart review completed.  

## 2011-03-14 NOTE — Progress Notes (Signed)
Subjective: Postpartum Day 1: Cesarean Delivery Patient reports tolerating PO and + flatus.  Denies Ha, blurred vision, or RUQ pain  Objective: Vital signs in last 24 hours: Temp:  [97.6 F (36.4 C)-98.9 F (37.2 C)] 98.9 F (37.2 C) (11/16 0700) Pulse Rate:  [25-115] 101  (11/16 0700) Resp:  [16-20] 18  (11/16 0700) BP: (89-146)/(62-114) 129/67 mmHg (11/16 0700) SpO2:  [80 %-100 %] 96 % (11/16 0700)  Physical Exam:  General: alert and cooperative Lochia: appropriate Uterine Fundus: firm abd dsg CDI abd soft + flatus Foley with adequate OP DVT Evaluation: No evidence of DVT seen on physical exam.   Basename 03/14/11 0535 03/14/11 0010  HGB 9.4* 9.4*  HCT 29.3* 29.6*    Assessment/Plan: Status post Cesarean section. Doing well postoperatively.  Continue current care.  Brien Lowe G 03/14/2011, 8:15 AM

## 2011-03-14 NOTE — Progress Notes (Signed)
Henderson Cloud, MD, speaks with the patient about a cesarean section. Patient informed of the risks and benefits. Patient agrees to this plan of care. Consents obtained.

## 2011-03-14 NOTE — Anesthesia Postprocedure Evaluation (Signed)
  Anesthesia Post-op Note  Patient: Natalie Kane  Procedure(s) Performed:  CESAREAN SECTION  Patient Location: Mother/Baby  Anesthesia Type: Epidural  Level of Consciousness: awake, alert  and oriented  Airway and Oxygen Therapy: Patient Spontanous Breathing  Post-op Pain: none  Post-op Assessment: Post-op Vital signs reviewed and Patient's Cardiovascular Status Stable  Post-op Vital Signs: Reviewed and stable  Complications: No apparent anesthesia complications

## 2011-03-14 NOTE — Addendum Note (Signed)
Addendum  created 03/14/11 0945 by Madison Hickman   Modules edited:Notes Section

## 2011-03-14 NOTE — Anesthesia Postprocedure Evaluation (Signed)
Anesthesia Post Note  Patient: Natalie Kane  Procedure(s) Performed:  CESAREAN SECTION  Anesthesia type: Epidural  Patient location: PACU  Post pain: Pain level controlled  Post assessment: Post-op Vital signs reviewed  Last Vitals:  Filed Vitals:   03/13/11 2345  BP: 124/70  Pulse:   Temp: 36.4 C  Resp:     Post vital signs: Reviewed  Level of consciousness: awake  Complications: No apparent anesthesia complications

## 2011-03-14 NOTE — Op Note (Signed)
Natalie Kane, Natalie Kane              ACCOUNT NO.:  [id number]  MEDICAL RECORD NO.:  [id number]  LOCATION:  [facility]                          FACILITY:  [facility]  PHYSICIAN:  Guy Sandifer. Henderson Cloud, M.D. DATE OF BIRTH:  11-05-1980  DATE OF PROCEDURE:  03/13/2011 DATE OF DISCHARGE:                              OPERATIVE REPORT   PREOPERATIVE DIAGNOSES: 1. Intrauterine pregnancy at 68 weeks' estimated gestational age. 2. Pregnancy-induced hypertension. 3. Arrest of dilation.  POSTOPERATIVE DIAGNOSES: 1. Intrauterine pregnancy at 103 weeks' estimated gestational age. 2. Pregnancy-induced hypertension. 3. Arrest of dilation.  PROCEDURE:  Primary low transverse cesarean section.  SURGEON:  Guy Sandifer. Henderson Cloud, M.D.  ANESTHESIA:  Epidural, __________.  ESTIMATED BLOOD LOSS:  500 mL.  FINDINGS:  A viable female infant, Apgars of 8 and 9 at 1 and 5 minutes respectively.  Arterial cord pH 7.26.  Birth weight pending.  SPECIMENS:  Placenta to pathology.  INDICATIONS AND CONSENT:  This patient is a 30 year old, G1, P0 at 36 weeks' admitted for induction of labor secondary to [facility].  She progresses to 3-4 cm dilation, complete effacement, -3 station with caput. __________ have been adequate for 3-4 hours.  In addition, there was a deceleration to the 80s that lasted for 2-3 minutes.  Cesarean section was recommended.  Potential risks and complications were discussed preoperatively including but not limited to, infection, organ damage, bleeding requiring transfusion of blood products with HIV and hepatitis acquisition, DVT, PE, and pneumonia.  All questions were answered. Consent signed on the chart.  PROCEDURE:  The patient was taken to the operating room for epidural anesthetic, was augmented to surgical level.  Time-out was undertaken. She was prepped and draped in sterile fashion.  Foley catheter was already in place.  After testing for adequate epidural anesthesia, skin was entered through a  Pfannenstiel incision and dissection was carried out in layers to the peritoneum.  Peritoneum was incised and extended superiorly and inferiorly.  Vesicouterine peritoneum was taken down cephalad laterally.  The bladder flap was developed and bladder blade was placed.  Uterus was incised in a low transverse manner.  The uterine cavity was entered bluntly with a hemostat.  Uterine incision was extended cephalad laterally with fingers.  Baby was delivered from the vertex position without difficulty.  Remainder of the baby was delivered and good cry and tone was noted.  Cord was clamped and cut and baby was handed to awaiting pediatrics team.  Placenta was manually delivered and sent to pathology.  Uterine cavity was clean.  Uterus was closed in 2 running locking imbricating layers of 0 Monocryl suture which achieves good hemostasis.  Anterior peritoneum was closed in running fashion with 2-0 Monocryl suture which was also used to reapproximate the pyramidalis muscle in midline.  Anterior rectus fascia was closed in running fashion with a 0 looped PDS suture.  Subcutaneous tissues were closed with interrupted plain suture and the subcuticular layer was closed with a running 2-0 Vicryl stitch.  Dressings were applied.  All counts were correct.  The patient was awakened and taken to recovery room in stable condition.     Guy Sandifer Henderson Cloud, M.D.     JET/MEDQ  D:  03/13/2011  T:  03/14/2011  Job:  [id number]

## 2011-03-15 MED ORDER — IBUPROFEN 600 MG PO TABS: 600.0000 mg | ORAL_TABLET | Freq: Four times a day (QID) | ORAL | Status: AC

## 2011-03-15 MED ORDER — OXYCODONE-ACETAMINOPHEN 5-325 MG PO TABS: 1.0000 | ORAL_TABLET | Freq: Four times a day (QID) | ORAL | Status: AC | PRN

## 2011-03-15 NOTE — Progress Notes (Signed)
Subjective:  Postpartum Day 2: Cesarean Delivery Patient reports + flatus.    Objective: Vital signs in last 24 hours: Temp:  [97.9 F (36.6 C)-99 F (37.2 C)] 98.2 F (36.8 C) (11/17 0604) Pulse Rate:  [81-101] 83  (11/17 0604) Resp:  [16-20] 18  (11/17 0604) BP: (108-134)/(66-79) 134/74 mmHg (11/17 0604) SpO2:  [96 %-98 %] 97 % (11/16 2141)  Physical Exam:  General: alert Lochia: appropriate Uterine Fundus: firm Incision: healing well, no significant drainage DVT Evaluation: No evidence of DVT seen on physical exam.   Basename 03/14/11 0535 03/14/11 0010  HGB 9.4* 9.4*  HCT 29.3* 29.6*    Assessment/Plan: Status post Cesarean section. Doing well postoperatively.  Pt req DC today...clips out  3 days.  Meriel Pica 03/15/2011, 8:39 AM

## 2011-03-15 NOTE — Discharge Summary (Signed)
Physician Discharge Summary  Patient ID: SHANEEQUA BAHNER MRN: [id number] DOB/AGE: [birth year] 30 y.o.  Admit date: 03/13/2011 Discharge date: 03/15/2011  Admission Diagnoses:  Discharge Diagnoses:  Principal Problem:  *PIH (pregnancy induced hypertension)   Discharged Condition: good  Hospital Course: induction secondary to [facility], LTCS for arrest of dilitation  Consults: none  Significant Diagnostic Studies: labs:  Results for orders placed during the hospital encounter of 03/13/11 (from the past 48 hour(s))  CBC     Status: Abnormal   Collection Time   03/13/11  6:37 PM      Component Value Range Comment   WBC 13.9 (*) 4.0 - 10.5 (K/uL)    RBC 3.40 (*) 3.87 - 5.11 (MIL/uL)    Hemoglobin 10.2 (*) 12.0 - 15.0 (g/dL)    HCT 78.2 (*) 95.6 - 46.0 (%)    MCV 91.5  78.0 - 100.0 (fL)    MCH 30.0  26.0 - 34.0 (pg)    MCHC 32.8  30.0 - 36.0 (g/dL)    RDW 21.3  08.6 - 57.8 (%)    Platelets 274  150 - 400 (K/uL)   CBC     Status: Abnormal   Collection Time   03/14/11 12:10 AM      Component Value Range Comment   WBC 15.8 (*) 4.0 - 10.5 (K/uL)    RBC 3.14 (*) 3.87 - 5.11 (MIL/uL)    Hemoglobin 9.4 (*) 12.0 - 15.0 (g/dL)    HCT 46.9 (*) 62.9 - 46.0 (%)    MCV 94.3  78.0 - 100.0 (fL)    MCH 29.9  26.0 - 34.0 (pg)    MCHC 31.8  30.0 - 36.0 (g/dL)    RDW 52.8  41.3 - 24.4 (%)    Platelets 255  150 - 400 (K/uL)   CBC     Status: Abnormal   Collection Time   03/14/11  5:35 AM      Component Value Range Comment   WBC 15.9 (*) 4.0 - 10.5 (K/uL)    RBC 3.18 (*) 3.87 - 5.11 (MIL/uL)    Hemoglobin 9.4 (*) 12.0 - 15.0 (g/dL)    HCT 01.0 (*) 27.2 - 46.0 (%)    MCV 92.1  78.0 - 100.0 (fL)    MCH 29.6  26.0 - 34.0 (pg)    MCHC 32.1  30.0 - 36.0 (g/dL)    RDW 53.6  64.4 - 03.4 (%)    Platelets 261  150 - 400 (K/uL)     Treatments: surgery: LTCS  Discharge Exam: Blood pressure 134/74, pulse 83, temperature 98.2 F (36.8 C), temperature source Oral, resp. rate 18, height 6' (1.829  m), weight 122.018 kg (269 lb), last menstrual period 06/13/2010, SpO2 97.00%, unknown if currently breastfeeding. Incision/Wound:C/D, staples IN  Disposition: Final discharge disposition not confirmed   Current Discharge Medication List    START taking these medications   Details  ibuprofen (ADVIL,MOTRIN) 600 MG tablet Take 1 tablet (600 mg total) by mouth every 6 (six) hours. Qty: 30 tablet, Refills: 2    oxyCODONE-acetaminophen (PERCOCET) 5-325 MG per tablet Take 1-2 tablets by mouth every 6 (six) hours as needed (moderate - severe pain). Qty: 30 tablet, Refills: 0      STOP taking these medications     prenatal vitamin w/FE, FA (PRENATAL 1 + 1) 27-1 MG TABS        Follow-up Information    Follow up with [facility] (office will call)    Contact information:  [address] [place] [address] [phone]          Signed: Meriel Pica 03/15/2011, 8:48 AM

## 2011-03-16 ENCOUNTER — Encounter (HOSPITAL_COMMUNITY): Payer: Self-pay | Admitting: Obstetrics and Gynecology

## 2012-06-18 LAB — HM PAP SMEAR: HM Pap smear: NORMAL

## 2013-01-08 ENCOUNTER — Emergency Department (HOSPITAL_BASED_OUTPATIENT_CLINIC_OR_DEPARTMENT_OTHER)
Admission: EM | Admit: 2013-01-08 | Discharge: 2013-01-08 | Disposition: A | Discharge status: Home / Self Care | Payer: BC Managed Care – PPO | Attending: Emergency Medicine | Admitting: Emergency Medicine

## 2013-01-08 ENCOUNTER — Encounter (HOSPITAL_BASED_OUTPATIENT_CLINIC_OR_DEPARTMENT_OTHER): Payer: Self-pay | Admitting: Emergency Medicine

## 2013-01-08 DIAGNOSIS — B084 Enteroviral vesicular stomatitis with exanthem: Secondary | ICD-10-CM

## 2013-01-08 DIAGNOSIS — R21 Rash and other nonspecific skin eruption: Secondary | ICD-10-CM

## 2013-01-08 DIAGNOSIS — Z79899 Other long term (current) drug therapy: Secondary | ICD-10-CM | POA: Insufficient documentation

## 2013-01-08 MED ORDER — HYDROXYZINE HCL 25 MG PO TABS
50.0000 mg | ORAL_TABLET | Freq: Once | ORAL | Status: AC
  Administered 2013-01-08: 50 mg via ORAL
  Filled 2013-01-08: qty 2
  Filled 2013-01-08: qty 1

## 2013-01-08 MED ORDER — HYDROXYZINE HCL 50 MG PO TABS: 50.0000 mg | ORAL_TABLET | Freq: Three times a day (TID) | ORAL | Status: DC | PRN

## 2013-01-08 MED ORDER — OXYCODONE-ACETAMINOPHEN 5-325 MG PO TABS
2.0000 | ORAL_TABLET | Freq: Once | ORAL | Status: AC
  Administered 2013-01-08: 2 via ORAL
  Filled 2013-01-08 (×2): qty 2

## 2013-01-08 MED ORDER — OXYCODONE-ACETAMINOPHEN 5-325 MG PO TABS: 1.0000 | ORAL_TABLET | Freq: Four times a day (QID) | ORAL | Status: DC | PRN

## 2013-01-08 NOTE — ED Provider Notes (Signed)
CSN: [id number]     Arrival date & time 01/08/13  1457 History  This chart was scribed for Dagmar Hait, MD by Ronal Fear, ED Scribe. This patient was seen in room [facility]2/[facility] and the patient's care was started at 4:28 PM.    Chief Complaint  Patient presents with  . Rash    Patient is a 32 y.o. female presenting with rash. The history is provided by a parent. No language interpreter was used.  Rash Location:  Hand and mouth Hand rash location:  R palm and L palm Quality: burning and itchiness   Severity:  Mild Onset quality:  Gradual Duration:  1 day Timing:  Constant Progression:  Worsening Chronicity:  New Context: exposure to similar rash (pt's child has hand foot and mouth disease)   Relieved by:  Nothing Worsened by:  Moisture Associated symptoms: fever   Associated symptoms: no nausea, no shortness of breath and not vomiting  Hoarse voice: 2.   Fever:    Duration:  2 days   Past Medical History  Diagnosis Date  . Fibroids     microscopic  . Pregnancy induced hypertension   . [id number](478.2)    Past Surgical History  Procedure Laterality Date  . Cesarean section  03/13/2011    Procedure: CESAREAN SECTION;  Surgeon: Roselle Locus II;  Location: [facility];  Service: Gynecology;  Laterality: N/A;   Family History  Problem Relation Age of Onset  . Heart disease Father   . Hypertension Father   . Cancer Father     prostate  . Stroke Maternal Grandmother   . Cancer Paternal Grandmother     colon  . Anesthesia problems Neg Hx   . Hypotension Neg Hx   . Malignant hyperthermia Neg Hx   . Pseudochol deficiency Neg Hx    History  Substance Use Topics  . Smoking status: Never Smoker   . Smokeless tobacco: Never Used  . Alcohol Use: No   OB History   Grav Para Term Preterm Abortions TAB SAB Ect Mult Living   1 1 1       1      Review of Systems  Constitutional: Positive for fever.  HENT: Hoarse voice: 2.   Respiratory: Negative for shortness of breath.    Gastrointestinal: Negative for nausea and vomiting.  Skin: Positive for rash.  All other systems reviewed and are negative.    Allergies  Review of patient's allergies indicates no known allergies.  Home Medications   Current Outpatient Rx  Name  Route  Sig  Dispense  Refill  . norgestimate-ethinyl estradiol (ORTHO-CYCLEN,SPRINTEC,PREVIFEM) 0.25-35 MG-MCG tablet   Oral   Take 1 tablet by mouth daily.         . hydrOXYzine (ATARAX/VISTARIL) 50 MG tablet   Oral   Take 1 tablet (50 mg total) by mouth every 8 (eight) hours as needed for itching.   30 tablet   0   . oxyCODONE-acetaminophen (PERCOCET/ROXICET) 5-325 MG per tablet   Oral   Take 1 tablet by mouth every 6 (six) hours as needed for pain.   10 tablet   0    BP 134/98  Pulse 82  Temp(Src) 98.9 F (37.2 C) (Oral)  Resp 18  Ht 6' (1.829 m)  Wt 215 lb (97.523 kg)  BMI 29.15 kg/m2  SpO2 100%  LMP 12/29/2012  Breastfeeding? No Physical Exam  Nursing note and vitals reviewed. Constitutional: She is oriented to person, place, and time. She appears well-developed  and well-nourished. No distress.  HENT:  Head: Normocephalic and atraumatic.  Small red lesions on hard and soft palate   Eyes: EOM are normal.  Neck: Neck supple. No tracheal deviation present.  Cardiovascular: Normal rate.   Pulmonary/Chest: Effort normal. No respiratory distress.  Abdominal: Soft. There is no tenderness.  Musculoskeletal: Normal range of motion.  Neurological: She is alert and oriented to person, place, and time.  Skin: Skin is warm and dry. Rash (blanchable maculopapular on bilateral palms with mild spread onto dorsal wrists) noted.  Psychiatric: She has a normal mood and affect. Her behavior is normal.    ED Course  Procedures (including critical care time)  DIAGNOSTIC STUDIES: Oxygen Saturation is 100% on RA, normal by my interpretation.    COORDINATION OF CARE: 3:46 PM- Pt advised of plan for treatment medications for  pain and atarax and pt agrees.      Labs Review Labs Reviewed - No data to display Imaging Review No results found.  MDM   1. Rash   2. Hand, foot and mouth disease    77F presents with a rash. Daughter recently diagnosed with hand-foot mouth. Patient has had rash since Thursday. He is progressively worsening. She also has lesions in her mouth. She had fever 2 days ago, however no fevers  Today. Patient's rash is on her palms. It is associated with severe pruritus. It is spreading to her wrists. She denies any recent sick exposures or concerns for syphilis. She's he apparently this morning given a shot of Benadryl for her itching which is not working. She is also put on doxy for concern of tickborne diseases. I instructed for her to continue the doxycycline for possible tickborne diseases. On exam she is blanchable maculopapular rashes on her palms with extension to her dorsal wrists bilaterally. She has no lesions on her soles of her feet. Entire mouth she has lesions similar to the ones on her hands on her soft and hard palate. This is likely hand foot mouth disease. I gave her Atarax for itching and Percocet for some pain which she describes as burning and radiating from her hands are performed. Will give smaller Atarax and pain medicine at home and instructed to follow up with PCP in 2 days.  I personally performed the services described in this documentation, which was scribed in my presence. The recorded information has been reviewed and is accurate.     Dagmar Hait, MD 01/08/13 [phone]

## 2013-01-08 NOTE — ED Notes (Signed)
Child with hand, foot, mouth disease.  Pt started with fever on Thursday.  Now with rash to hands and inside mouth.  C/o generalized itching.  Given shot of Benadryl at [facility] this am, but no relief of itching.

## 2014-02-27 ENCOUNTER — Encounter (HOSPITAL_BASED_OUTPATIENT_CLINIC_OR_DEPARTMENT_OTHER): Payer: Self-pay | Admitting: Emergency Medicine

## 2014-07-12 ENCOUNTER — Other Ambulatory Visit: Payer: Self-pay | Admitting: Advanced Practice Midwife

## 2015-05-14 ENCOUNTER — Telehealth: Payer: Self-pay | Admitting: Family

## 2015-05-14 ENCOUNTER — Encounter: Payer: Self-pay | Admitting: Physician Assistant

## 2015-05-14 ENCOUNTER — Ambulatory Visit (INDEPENDENT_AMBULATORY_CARE_PROVIDER_SITE_OTHER): Payer: BC Managed Care – PPO | Admitting: Physician Assistant

## 2015-05-14 VITALS — BP 131/77 | HR 92 | Temp 97.9°F | Ht 72.0 in | Wt 251.2 lb

## 2015-05-14 DIAGNOSIS — H1131 Conjunctival hemorrhage, right eye: Secondary | ICD-10-CM

## 2015-05-14 DIAGNOSIS — T148 Other injury of unspecified body region: Secondary | ICD-10-CM

## 2015-05-14 DIAGNOSIS — T148XXA Other injury of unspecified body region, initial encounter: Secondary | ICD-10-CM | POA: Insufficient documentation

## 2015-05-14 DIAGNOSIS — L732 Hidradenitis suppurativa: Secondary | ICD-10-CM | POA: Insufficient documentation

## 2015-05-14 MED ORDER — MUPIROCIN CALCIUM 2 % NA OINT: 1.0000 "application " | TOPICAL_OINTMENT | Freq: Two times a day (BID) | NASAL | Status: DC

## 2015-05-14 MED ORDER — SULFAMETHOXAZOLE-TRIMETHOPRIM 800-160 MG PO TABS: 1.0000 | ORAL_TABLET | Freq: Two times a day (BID) | ORAL | Status: DC

## 2015-05-14 MED ORDER — MELOXICAM 15 MG PO TABS: 15.0000 mg | ORAL_TABLET | Freq: Every day | ORAL | Status: DC

## 2015-05-14 NOTE — Assessment & Plan Note (Signed)
Rx Bactrim instead of the Minocycline. Bactroban to apply nasally. Supportive measures reviewed. Follow-up with Melissa as scheduled.

## 2015-05-14 NOTE — Telephone Encounter (Signed)
error:[id number] ° °

## 2015-05-14 NOTE — Progress Notes (Signed)
    Patient presents to clinic today as a new patient for acute concerns. Is establishing with another provider in early February.  Patient endorses history of recurrent boils in armpits bilaterally over the past year. Was treated last month with Minocycline with improvement in symptoms but now a "boil" has recurred in the left armpit. Denies fever, chills, malaise.  Patient also c/o small cut to the right side of her nose from a pan falling from her upper kitchen cabinets this morning. Endorses minimal bleeding. Endorses cleaning area and applying neosporin to the area.   Past Medical History  Diagnosis Date  . Fibroids     microscopic  . Pregnancy induced hypertension   . Headache(784.0)   . Chicken pox     Current Outpatient Prescriptions on File Prior to Visit  Medication Sig Dispense Refill  . norgestimate-ethinyl estradiol (ORTHO-CYCLEN,SPRINTEC,PREVIFEM) 0.25-35 MG-MCG tablet Take 1 tablet by mouth daily. Reported on 05/14/2015     No current facility-administered medications on file prior to visit.    No Known Allergies  Family History  Problem Relation Age of Onset  . Hypertension Father   . Cancer Father     prostate  . Stroke Maternal Grandmother   . Cancer Paternal Grandmother     colon  . Anesthesia problems Neg Hx   . Hypotension Neg Hx   . Malignant hyperthermia Neg Hx   . Pseudochol deficiency Neg Hx     Social History   Social History  . Marital Status: Single    Spouse Name: N/A  . Number of Children: N/A  . Years of Education: N/A   Social History Main Topics  . Smoking status: Never Smoker   . Smokeless tobacco: Never Used  . Alcohol Use: No  . Drug Use: No  . Sexual Activity: Yes   Other Topics Concern  . None   Social History Narrative   Review of Systems  Constitutional: Negative for fever.  HENT: Positive for congestion, ear pain and sore throat.   Respiratory: Positive for cough and sputum production. Negative for hemoptysis and  shortness of breath.   Cardiovascular: Negative for chest pain and palpitations.  Neurological: Negative for headaches.   BP 131/77 mmHg  Pulse 92  Temp(Src) 97.9 F (36.6 C) (Oral)  Ht 6' (1.829 m)  Wt 251 lb 3.2 oz (113.944 kg)  BMI 34.06 kg/m2  SpO2 100%  LMP 04/18/2015  Physical Exam  Constitutional: She is well-developed, well-nourished, and in no distress.  HENT:  Head: Normocephalic and atraumatic.  Eyes: Pupils are equal, round, and reactive to light. Right conjunctiva is not injected. Right conjunctiva has a hemorrhage. Left conjunctiva is not injected. Left conjunctiva has no hemorrhage.  Neck: Neck supple.  Cardiovascular: Normal rate, regular rhythm, normal heart sounds and intact distal pulses.   Pulmonary/Chest: Effort normal and breath sounds normal. No respiratory distress. She has no wheezes. She has no rales. She exhibits no tenderness.  Skin:     Vitals reviewed.  No results found for this or any previous visit (from the past 2160 hour(s)).  Assessment/Plan: Hidradenitis suppurativa of left axilla Rx Bactrim instead of the Minocycline. Bactroban to apply nasally. Supportive measures reviewed. Follow-up with Melissa as scheduled.  Subconjunctival hemorrhage of right eye Discussed prognosis. Will resolve on its own.  Superficial laceration Very mild. Not requiring stiches. Wound care discussed. Follow-up PRN.

## 2015-05-14 NOTE — Patient Instructions (Signed)
Please keep the cut on the nose clean and dry. Use neosporin to the area. Stop the Minocyclin and begin the Bactrim twice daily. Apply the Bactroban nasal ointment into the nose as directed.  Follow-up with Melissa as scheduled.

## 2015-05-14 NOTE — Assessment & Plan Note (Signed)
Discussed prognosis. Will resolve on its own.

## 2015-05-14 NOTE — Assessment & Plan Note (Signed)
Very mild. Not requiring stiches. Wound care discussed. Follow-up PRN.

## 2015-05-14 NOTE — Progress Notes (Signed)
Pre visit review using our clinic review tool, if applicable. No additional management support is needed unless otherwise documented below in the visit note. 

## 2015-06-04 ENCOUNTER — Encounter: Payer: Self-pay | Admitting: Family

## 2015-06-04 ENCOUNTER — Ambulatory Visit (INDEPENDENT_AMBULATORY_CARE_PROVIDER_SITE_OTHER): Payer: BC Managed Care – PPO | Admitting: Family

## 2015-06-04 VITALS — BP 120/78 | HR 94 | Temp 98.5°F | Resp 16 | Ht 72.0 in | Wt 246.6 lb

## 2015-06-04 DIAGNOSIS — L732 Hidradenitis suppurativa: Secondary | ICD-10-CM | POA: Diagnosis not present

## 2015-06-04 DIAGNOSIS — R197 Diarrhea, unspecified: Secondary | ICD-10-CM | POA: Diagnosis not present

## 2015-06-04 MED ORDER — SULFAMETHOXAZOLE-TRIMETHOPRIM 800-160 MG PO TABS: 1.0000 | ORAL_TABLET | Freq: Two times a day (BID) | ORAL | Status: DC

## 2015-06-04 NOTE — Patient Instructions (Signed)
Start bactrim twice daily for 5 days. You will be contacted about  Your referral to the general surgeon. Call if you develop increased pain, fever, if diarrhea worsens or if diarrhea is not improved in 24 hours. Make sure to drink plenty of fluid today.

## 2015-06-04 NOTE — Progress Notes (Signed)
Subjective:    Patient ID: Natalie Kane, female    DOB: Jul 07, 1980, 35 y.o.   MRN: [id number]  HPI  Natalie Kane is a 35 yr old female who presents today with two concerns:  1) Hidradenitis suppurativa- Last visit she reported a boil in the left armpit.  (05/14/15) Was treated with bactrim. She has been to see dermatology. In the past she has taken   2) Diarrhea- pt reports 11 episodes of loose stools today.  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 No known sick contacts.  Denies abdominal pain, tolerating PO's.    Review of Systems See HPI  Past Medical History  Diagnosis Date  . Fibroids     microscopic  . Pregnancy induced hypertension   . Headache(784.0)   . Chicken pox     Social History   Social History  . Marital Status: Single    Spouse Name: N/A  . Number of Children: N/A  . Years of Education: N/A   Occupational History  . Not on file.   Social History Main Topics  . Smoking status: Never Smoker   . Smokeless tobacco: Never Used  . Alcohol Use: No  . Drug Use: No  . Sexual Activity: Yes   Other Topics Concern  . Not on file   Social History Narrative    Past Surgical History  Procedure Laterality Date  . Cesarean section  03/13/2011    Procedure: CESAREAN SECTION;  Surgeon: Shon Millet II;  Location: [facility];  Service: Gynecology;  Laterality: N/A;    Family History  Problem Relation Age of Onset  . Hypertension Father   . Cancer Father     prostate  . Stroke Maternal Grandmother   . Cancer Paternal Grandmother     colon  . Anesthesia problems Neg Hx   . Hypotension Neg Hx   . Malignant hyperthermia Neg Hx   . Pseudochol deficiency Neg Hx     No Known Allergies  Current Outpatient Prescriptions on File Prior to Visit  Medication Sig Dispense Refill  . norgestimate-ethinyl estradiol (ORTHO-CYCLEN,SPRINTEC,PREVIFEM) 0.25-35 MG-MCG tablet Take 1 tablet by mouth daily. Reported on 05/14/2015     No current facility-administered  medications on file prior to visit.    BP 120/78 mmHg  Pulse 94  Temp(Src) 98.5 F (36.9 C) (Oral)  Resp 16  Ht 6' (1.829 m)  Wt 246 lb 9.6 oz (111.857 kg)  BMI 33.44 kg/m2  SpO2 100%  LMP 05/16/2015       Objective:   Physical Exam  Constitutional: She appears well-developed and well-nourished.  Cardiovascular: Normal rate, regular rhythm and normal heart sounds.   No murmur heard. Pulmonary/Chest: Effort normal and breath sounds normal. No respiratory distress. She has no wheezes.  Abdominal: Soft. Bowel sounds are normal. She exhibits no distension. There is no tenderness.  Skin:  Small pocket of fluctuance left axilla, no significant erythema. Another small firm pea sized boil noted left axilla  Psychiatric: She has a normal mood and affect. Her behavior is normal. Judgment and thought content normal.          Assessment & Plan:  Hidradentitis suppurativa- Pt has new area of fluctuance since last thursday. advised pt to restart bactrim x 5 days.  Will refer to general surgeon for consideration of I and D and excision.    Diarrhea- likely viral etiology. I have asked the pt to ensure adequate hydration and let me know if symptoms do not continue to  improve. Would consider c diff testing at that time due to recent abx.  Pt verbalizes understandings.

## 2015-06-04 NOTE — Progress Notes (Signed)
Pre visit review using our clinic review tool, if applicable. No additional management support is needed unless otherwise documented below in the visit note. 

## 2015-06-18 ENCOUNTER — Telehealth: Payer: Self-pay | Admitting: Behavioral Health

## 2015-06-18 NOTE — Telephone Encounter (Signed)
Attempted to reach patient for Pre-Visit Call. The voice mailbox is full and cannot accept any messages at this time.

## 2015-06-19 ENCOUNTER — Encounter: Payer: Self-pay | Admitting: Family

## 2015-06-19 ENCOUNTER — Ambulatory Visit (INDEPENDENT_AMBULATORY_CARE_PROVIDER_SITE_OTHER): Payer: BC Managed Care – PPO | Admitting: Family

## 2015-06-19 ENCOUNTER — Telehealth: Payer: Self-pay | Admitting: Family

## 2015-06-19 VITALS — BP 120/72 | HR 82 | Temp 98.0°F | Resp 16 | Ht 72.0 in | Wt 240.2 lb

## 2015-06-19 DIAGNOSIS — J029 Acute pharyngitis, unspecified: Secondary | ICD-10-CM

## 2015-06-19 DIAGNOSIS — J020 Streptococcal pharyngitis: Secondary | ICD-10-CM

## 2015-06-19 LAB — POCT RAPID STREP A (OFFICE): Rapid Strep A Screen: POSITIVE — AB

## 2015-06-19 MED ORDER — AMOXICILLIN 500 MG PO CAPS: 500.0000 mg | ORAL_CAPSULE | Freq: Two times a day (BID) | ORAL | Status: DC

## 2015-06-19 MED ORDER — FLUCONAZOLE 150 MG PO TABS: 150.0000 mg | ORAL_TABLET | Freq: Once | ORAL | Status: DC

## 2015-06-19 NOTE — Progress Notes (Signed)
   Subjective:    Patient ID: Natalie Kane, female    DOB: 05/05/1980, 35 y.o.   MRN: [id number]  HPI  Natalie Kane is a 35 yr old female with c/o Ha, sore throat, eyes watering.  Nasal congestion. Reports subjective temp.     Pmhx is signficant for hidradenitis suppurativa - completed abx. Has apt with surgeon next Tuesday.   Review of Systems See HPI  Past Medical History  Diagnosis Date  . Fibroids     microscopic  . Pregnancy induced hypertension   . Headache(784.0)   . Chicken pox     Social History   Social History  . Marital Status: Single    Spouse Name: N/A  . Number of Children: N/A  . Years of Education: N/A   Occupational History  . Not on file.   Social History Main Topics  . Smoking status: Never Smoker   . Smokeless tobacco: Never Used  . Alcohol Use: No  . Drug Use: No  . Sexual Activity: Yes   Other Topics Concern  . 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     Past Surgical History  Procedure Laterality Date  . Cesarean section  03/13/2011    Procedure: CESAREAN SECTION;  Surgeon: Shon Millet II;  Location: [facility];  Service: Gynecology;  Laterality: N/A;    Family History  Problem Relation Age of Onset  . Hypertension Father   . Cancer Father     prostate  . Stroke Maternal Grandmother   . Cancer Paternal Grandmother     colon  . Anesthesia problems Neg Hx   . Hypotension Neg Hx   . Malignant hyperthermia Neg Hx   . Pseudochol deficiency Neg Hx     No Known Allergies  Current Outpatient Prescriptions on File Prior to Visit  Medication Sig Dispense Refill  . norgestimate-ethinyl estradiol (ORTHO-CYCLEN,SPRINTEC,PREVIFEM) 0.25-35 MG-MCG tablet Take 1 tablet by mouth daily. Reported on 05/14/2015     No current facility-administered medications on file prior to visit.    BP 120/72 mmHg  Pulse 82  Temp(Src) 98  F (36.7 C) (Oral)  Resp 16  Ht 6' (1.829 m)  Wt 240 lb 3.2 oz (108.954 kg)  BMI 32.57 kg/m2  SpO2 98%  LMP 05/09/2015       Objective:   Physical Exam  Constitutional: She appears well-developed and well-nourished.  HENT:  Head: Normocephalic and atraumatic.  Cardiovascular: Normal rate, regular rhythm and normal heart sounds.   No murmur heard. Pulmonary/Chest: Effort normal and breath sounds normal. No respiratory distress. She has no wheezes.  Lymphadenopathy:    She has no cervical adenopathy.  Neurological: A cranial nerve deficit is present.  Psychiatric: She has a normal mood and affect. Her behavior is normal. Judgment and thought content normal.          Assessment & Plan:  Strep pharyngitis- rx with amoxicillin.  Advised Pt:   You may use tylenol or motrin as needed for pain. Please call if symptoms worsen, if you develop fever >101 or if symptoms are not improved in 2-3 days.

## 2015-06-19 NOTE — Progress Notes (Signed)
Pre visit review using our clinic review tool, if applicable. No additional management support is needed unless otherwise documented below in the visit note. 

## 2015-06-19 NOTE — Telephone Encounter (Signed)
rx sent

## 2015-06-19 NOTE — Patient Instructions (Signed)
Start amoxicillin for strep. You may use tylenol or motrin as needed for pain. Please call if symptoms worsen, if you develop fever >101 or if symptoms are not improved in 2-3 days.

## 2015-06-19 NOTE — Telephone Encounter (Signed)
Relation to [id number] Call back number: [id number] [id number] - [place], [place] [address] [phone] (Phone) [phone] (Fax)         Reason for call:  Patients states whenever she's on an antibiotic it causes her to have a yeast  Infection, patient requesting a Rx

## 2015-06-20 NOTE — Telephone Encounter (Signed)
Attempted to notify pt but mailbox is full and unable to leave message.

## 2015-07-13 ENCOUNTER — Other Ambulatory Visit: Payer: Self-pay | Admitting: Surgery

## 2015-07-13 ENCOUNTER — Encounter: Payer: BC Managed Care – PPO | Admitting: Family

## 2015-07-13 ENCOUNTER — Telehealth: Payer: Self-pay | Admitting: Family

## 2015-07-13 NOTE — Telephone Encounter (Signed)
Patient daughter was dx with the flu therefore patient had to cancel her physical appointment for today at [facility]. Patient [facility] to 08/24/15. Charge or no charge

## 2015-07-13 NOTE — Telephone Encounter (Signed)
No charge. 

## 2015-08-23 ENCOUNTER — Telehealth: Payer: Self-pay | Admitting: Behavioral Health

## 2015-08-23 NOTE — Telephone Encounter (Signed)
Unable to reach patient at time of Pre-Visit Call.  Left message for patient to return call when available.    

## 2015-08-24 ENCOUNTER — Telehealth: Payer: Self-pay | Admitting: Family

## 2015-08-24 ENCOUNTER — Encounter: Payer: BC Managed Care – PPO | Admitting: Family

## 2015-08-24 ENCOUNTER — Other Ambulatory Visit: Payer: Self-pay | Admitting: Surgery

## 2015-08-29 NOTE — Telephone Encounter (Signed)
No charge. 

## 2015-08-29 NOTE — Telephone Encounter (Signed)
Pt was no show 08/24/15 11:15pm for cpe, may have been late based on timing that her reschedule appt was made (11:50pm pt was rescheduled), charge or no charge?

## 2015-09-16 ENCOUNTER — Encounter (HOSPITAL_BASED_OUTPATIENT_CLINIC_OR_DEPARTMENT_OTHER): Payer: Self-pay

## 2015-09-16 DIAGNOSIS — R110 Nausea: Secondary | ICD-10-CM | POA: Insufficient documentation

## 2015-09-16 DIAGNOSIS — R072 Precordial pain: Secondary | ICD-10-CM | POA: Insufficient documentation

## 2015-09-16 DIAGNOSIS — R0601 Orthopnea: Secondary | ICD-10-CM | POA: Diagnosis not present

## 2015-09-16 NOTE — ED Notes (Signed)
Pt reports intermittent, sharp chest pain x 7 days. Reports some nausea denies vomiting. Reports some shortness of breath.

## 2015-09-17 ENCOUNTER — Emergency Department (HOSPITAL_BASED_OUTPATIENT_CLINIC_OR_DEPARTMENT_OTHER)
Admission: EM | Admit: 2015-09-17 | Discharge: 2015-09-17 | Disposition: A | Discharge status: Home / Self Care | Payer: BC Managed Care – PPO | Attending: Emergency Medicine | Admitting: Emergency Medicine

## 2015-09-17 ENCOUNTER — Emergency Department (HOSPITAL_BASED_OUTPATIENT_CLINIC_OR_DEPARTMENT_OTHER): Payer: BC Managed Care – PPO

## 2015-09-17 DIAGNOSIS — R0601 Orthopnea: Secondary | ICD-10-CM

## 2015-09-17 DIAGNOSIS — R079 Chest pain, unspecified: Secondary | ICD-10-CM

## 2015-09-17 LAB — BASIC METABOLIC PANEL
Anion gap: 6 (ref 5–15)
BUN: 10 mg/dL (ref 6–20)
CO2: 28 mmol/L (ref 22–32)
Calcium: 9 mg/dL (ref 8.9–10.3)
Chloride: 103 mmol/L (ref 101–111)
Creatinine, Ser: 0.77 mg/dL (ref 0.44–1.00)
GFR calc Af Amer: >60 mL/min (ref >60)
GFR calc non Af Amer: >60 mL/min (ref >60)
Glucose, Bld: 97 mg/dL (ref 65–99)
Potassium: 3.8 mmol/L (ref 3.5–5.1)
Sodium: 137 mmol/L (ref 135–145)

## 2015-09-17 LAB — CBC
HCT: 34.4 % — ABNORMAL LOW (ref 36.0–46.0)
Hemoglobin: 11.3 g/dL — ABNORMAL LOW (ref 12.0–15.0)
MCH: 29.8 pg (ref 26.0–34.0)
MCHC: 32.8 g/dL (ref 30.0–36.0)
MCV: 90.8 fL (ref 78.0–100.0)
Platelets: 425 10*3/uL — ABNORMAL HIGH (ref 150–400)
RBC: 3.79 MIL/uL — ABNORMAL LOW (ref 3.87–5.11)
RDW: 13.3 % (ref 11.5–15.5)
WBC: 9.1 10*3/uL (ref 4.0–10.5)

## 2015-09-17 LAB — TROPONIN I: Troponin I: <0.03 ng/mL (ref <0.031)

## 2015-09-17 MED ORDER — PANTOPRAZOLE SODIUM 40 MG PO TBEC
40.0000 mg | DELAYED_RELEASE_TABLET | Freq: Once | ORAL | Status: AC
  Administered 2015-09-17: 40 mg via ORAL
  Filled 2015-09-17: qty 1

## 2015-09-17 MED ORDER — OMEPRAZOLE 20 MG PO CPDR: 20.0000 mg | DELAYED_RELEASE_CAPSULE | Freq: Every day | ORAL | Status: DC

## 2015-09-17 NOTE — ED Provider Notes (Signed)
CSN: [id number]     Arrival date & time 09/16/15  2323 History   First MD Initiated Contact with Patient 09/17/15 0231     Chief Complaint  Patient presents with  . Chest Pain     (Consider location/radiation/quality/duration/timing/severity/associated sxs/prior Treatment) HPI  This is a 35 year old female with about a two-week history of shortness of breath when lying supine. Symptoms began mild but it become moderate. She also has some precordial chest pain which she describes as a soreness. This also is worse when she lies supine and is exacerbated by deep breathing. She denies fever, chills, abdominal pain, leg pain or swelling.  Past Medical History  Diagnosis Date  . Fibroids     microscopic  . Pregnancy induced hypertension   . Headache(784.0)   . Chicken pox    Past Surgical History  Procedure Laterality Date  . Cesarean section  03/13/2011    Procedure: CESAREAN SECTION;  Surgeon: Shon Millet II;  Location: [facility];  Service: Gynecology;  Laterality: N/A;   Family History  Problem Relation Age of Onset  . Hypertension Father   . Cancer Father     prostate  . Stroke Maternal Grandmother   . Cancer Paternal Grandmother     colon  . Anesthesia problems Neg Hx   . Hypotension Neg Hx   . Malignant hyperthermia Neg Hx   . Pseudochol deficiency Neg Hx    Social History  Substance Use Topics  . Smoking status: Never Smoker   . Smokeless tobacco: Never Used  . Alcohol Use: No   OB History    Gravida Para Term Preterm AB TAB SAB Ectopic Multiple Living   1 1 1       1      Review of Systems  All other systems reviewed and are negative.   Allergies  Review of patient's allergies indicates no known allergies.  Home Medications   Prior to Admission medications   Medication Sig Start Date End Date Taking? Authorizing Provider  amoxicillin (AMOXIL) 500 MG capsule Take 1 capsule (500 mg total) by mouth 2 (two) times daily. 06/19/15   Debbrah Alar, NP   fluconazole (DIFLUCAN) 150 MG tablet Take 1 tablet (150 mg total) by mouth once. 06/19/15   Debbrah Alar, NP  norgestimate-ethinyl estradiol (ORTHO-CYCLEN,SPRINTEC,PREVIFEM) 0.25-35 MG-MCG tablet Take 1 tablet by mouth daily. Reported on 05/14/2015    Historical Provider, MD   BP 124/78 mmHg  Pulse 65  Temp(Src) 98.4 F (36.9 C) (Oral)  Resp 18  Ht 6' (1.829 m)  Wt 220 lb (99.791 kg)  BMI 29.83 kg/m2  SpO2 100%  LMP 09/09/2015   Physical Exam  General: Well-developed, well-nourished female in no acute distress; appearance consistent with age of record HENT: normocephalic; atraumatic Eyes: pupils equal, round and reactive to light; extraocular muscles intact Neck: supple Heart: regular rate and rhythm Lungs: clear to auscultation bilaterally Chest: Parasternal tenderness that is different than the pain she has when lying supine Abdomen: soft; nondistended; nontender; no masses or hepatosplenomegaly; bowel sounds present Extremities: No deformity; full range of motion; pulses normal Neurologic: Awake, alert and oriented; motor function intact in all extremities and symmetric; no facial droop Skin: Warm and dry Psychiatric: Normal mood and affect    ED Course  Procedures (including critical care time)   EKG Interpretation   Date/Time:  Sunday Sep 16 2015 23:33:14 EDT Ventricular Rate:  66 PR Interval:  168 QRS Duration: 108 QT Interval:  406 QTC Calculation: 425 R  Axis:   61 Text Interpretation:  Normal sinus rhythm Normal ECG Rate is slower  Confirmed by Azriella Mattia  MD, Jenny Reichmann ([id number]) on 09/17/2015 1:11:09 AM      MDM   Nursing notes and vitals signs, including pulse oximetry, reviewed.  Summary of this visit's results, reviewed by myself:  Labs:  Results for orders placed or performed during the hospital encounter of 09/17/15 (from the past 24 hour(s))  Basic metabolic panel     Status: None   Collection Time: 09/17/15  1:28 AM  Result Value Ref Range   Sodium  137 135 - 145 mmol/L   Potassium 3.8 3.5 - 5.1 mmol/L   Chloride 103 101 - 111 mmol/L   CO2 28 22 - 32 mmol/L   Glucose, Bld 97 65 - 99 mg/dL   BUN 10 6 - 20 mg/dL   Creatinine, Ser 0.77 0.44 - 1.00 mg/dL   Calcium 9.0 8.9 - 10.3 mg/dL   GFR calc non Af Amer >60 >60 mL/min   GFR calc Af Amer >60 >60 mL/min   Anion gap 6 5 - 15  CBC     Status: Abnormal   Collection Time: 09/17/15  1:28 AM  Result Value Ref Range   WBC 9.1 4.0 - 10.5 K/uL   RBC 3.79 (L) 3.87 - 5.11 MIL/uL   Hemoglobin 11.3 (L) 12.0 - 15.0 g/dL   HCT 34.4 (L) 36.0 - 46.0 %   MCV 90.8 78.0 - 100.0 fL   MCH 29.8 26.0 - 34.0 pg   MCHC 32.8 30.0 - 36.0 g/dL   RDW 13.3 11.5 - 15.5 %   Platelets 425 (H) 150 - 400 K/uL  Troponin I     Status: None   Collection Time: 09/17/15  1:28 AM  Result Value Ref Range   Troponin I <0.03 <0.031 ng/mL    Imaging Studies: Dg Chest 2 View  09/17/2015  CLINICAL DATA:  Progressive shortness of breath for 2 weeks. Chest pain. EXAM: CHEST  2 VIEW COMPARISON:  None. FINDINGS: The cardiomediastinal contours are normal. The lungs are clear. Pulmonary vasculature is normal. No consolidation, pleural effusion, or pneumothorax. No acute osseous abnormalities are seen. IMPRESSION: No acute pulmonary process. Electronically Signed   By: Jeb Levering M.D.   On: 09/17/2015 02:49   The patient's exam and diagnostic studies are all unremarkable. The cause of her symptoms is not clear although it could represent GERD. We will start a PPI and have her follow-up with her PCP, Dr. Inda Castle.    Shanon Rosser, MD 09/17/15 [phone]

## 2015-09-17 NOTE — ED Notes (Signed)
Patient reports SOB and Chest worsening when she take a deep breath. Patient is sitting in room in no distress during assessment, talking in complete sentences. Protocols started

## 2015-10-02 ENCOUNTER — Encounter: Payer: BC Managed Care – PPO | Admitting: Family

## 2015-10-16 ENCOUNTER — Emergency Department (HOSPITAL_BASED_OUTPATIENT_CLINIC_OR_DEPARTMENT_OTHER): Payer: BC Managed Care – PPO

## 2015-10-16 ENCOUNTER — Observation Stay (HOSPITAL_BASED_OUTPATIENT_CLINIC_OR_DEPARTMENT_OTHER)
Admission: EM | Admit: 2015-10-16 | Discharge: 2015-10-18 | Disposition: A | Discharge status: Home / Self Care | Payer: BC Managed Care – PPO | Attending: General Surgery | Admitting: General Surgery

## 2015-10-16 ENCOUNTER — Encounter (HOSPITAL_BASED_OUTPATIENT_CLINIC_OR_DEPARTMENT_OTHER): Payer: Self-pay | Admitting: Emergency Medicine

## 2015-10-16 DIAGNOSIS — I10 Essential (primary) hypertension: Secondary | ICD-10-CM | POA: Insufficient documentation

## 2015-10-16 DIAGNOSIS — K8050 Calculus of bile duct without cholangitis or cholecystitis without obstruction: Secondary | ICD-10-CM | POA: Diagnosis present

## 2015-10-16 DIAGNOSIS — K8012 Calculus of gallbladder with acute and chronic cholecystitis without obstruction: Secondary | ICD-10-CM | POA: Diagnosis not present

## 2015-10-16 DIAGNOSIS — R1011 Right upper quadrant pain: Secondary | ICD-10-CM

## 2015-10-16 DIAGNOSIS — R109 Unspecified abdominal pain: Secondary | ICD-10-CM | POA: Diagnosis present

## 2015-10-16 DIAGNOSIS — K8010 Calculus of gallbladder with chronic cholecystitis without obstruction: Secondary | ICD-10-CM | POA: Diagnosis present

## 2015-10-16 LAB — CBC
HCT: 33.3 % — ABNORMAL LOW (ref 36.0–46.0)
HCT: 33.6 % — ABNORMAL LOW (ref 36.0–46.0)
Hemoglobin: 11 g/dL — ABNORMAL LOW (ref 12.0–15.0)
Hemoglobin: 11.3 g/dL — ABNORMAL LOW (ref 12.0–15.0)
MCH: 30.5 pg (ref 26.0–34.0)
MCH: 30.3 pg (ref 26.0–34.0)
MCHC: 33 g/dL (ref 30.0–36.0)
MCHC: 33.6 g/dL (ref 30.0–36.0)
MCV: 92.2 fL (ref 78.0–100.0)
MCV: 90.1 fL (ref 78.0–100.0)
Platelets: 447 10*3/uL — ABNORMAL HIGH (ref 150–400)
Platelets: 436 10*3/uL — ABNORMAL HIGH (ref 150–400)
RBC: 3.61 MIL/uL — ABNORMAL LOW (ref 3.87–5.11)
RBC: 3.73 MIL/uL — ABNORMAL LOW (ref 3.87–5.11)
RDW: 13 % (ref 11.5–15.5)
RDW: 13.5 % (ref 11.5–15.5)
WBC: 11.2 10*3/uL — ABNORMAL HIGH (ref 4.0–10.5)
WBC: 13 10*3/uL — ABNORMAL HIGH (ref 4.0–10.5)

## 2015-10-16 LAB — SURGICAL PCR SCREEN
MRSA, PCR: NEGATIVE
Staphylococcus aureus: NEGATIVE

## 2015-10-16 LAB — CREATININE, SERUM
Creatinine, Ser: 0.63 mg/dL (ref 0.44–1.00)
GFR calc Af Amer: >60 mL/min (ref >60)
GFR calc non Af Amer: >60 mL/min (ref >60)

## 2015-10-16 LAB — URINALYSIS, ROUTINE W REFLEX MICROSCOPIC
Bilirubin Urine: NEGATIVE
Glucose, UA: NEGATIVE mg/dL
Hgb urine dipstick: NEGATIVE
Ketones, ur: NEGATIVE mg/dL
Leukocytes, UA: NEGATIVE
Nitrite: NEGATIVE
Protein, ur: NEGATIVE mg/dL
Specific Gravity, Urine: 1.025 (ref 1.005–1.030)
pH: 7 (ref 5.0–8.0)

## 2015-10-16 LAB — COMPREHENSIVE METABOLIC PANEL
ALT: 11 U/L — ABNORMAL LOW (ref 14–54)
AST: 17 U/L (ref 15–41)
Albumin: 3.7 g/dL (ref 3.5–5.0)
Alkaline Phosphatase: 59 U/L (ref 38–126)
Anion gap: 8 (ref 5–15)
BUN: 7 mg/dL (ref 6–20)
CO2: 26 mmol/L (ref 22–32)
Calcium: 8.6 mg/dL — ABNORMAL LOW (ref 8.9–10.3)
Chloride: 101 mmol/L (ref 101–111)
Creatinine, Ser: 0.77 mg/dL (ref 0.44–1.00)
GFR calc Af Amer: >60 mL/min (ref >60)
GFR calc non Af Amer: >60 mL/min (ref >60)
Glucose, Bld: 121 mg/dL — ABNORMAL HIGH (ref 65–99)
Potassium: 3.3 mmol/L — ABNORMAL LOW (ref 3.5–5.1)
Sodium: 135 mmol/L (ref 135–145)
Total Bilirubin: 0.4 mg/dL (ref 0.3–1.2)
Total Protein: 7.4 g/dL (ref 6.5–8.1)

## 2015-10-16 LAB — DIFFERENTIAL
Basophils Absolute: 0 10*3/uL (ref 0.0–0.1)
Basophils Relative: 0 %
Eosinophils Absolute: 0.1 10*3/uL (ref 0.0–0.7)
Eosinophils Relative: 1 %
Lymphocytes Relative: 14 %
Lymphs Abs: 1.6 10*3/uL (ref 0.7–4.0)
Monocytes Absolute: 0.6 10*3/uL (ref 0.1–1.0)
Monocytes Relative: 5 %
Neutro Abs: 9.2 10*3/uL — ABNORMAL HIGH (ref 1.7–7.7)
Neutrophils Relative %: 80 %

## 2015-10-16 LAB — PREGNANCY, URINE: Preg Test, Ur: NEGATIVE

## 2015-10-16 LAB — LIPASE, BLOOD: Lipase: 14 U/L (ref 11–51)

## 2015-10-16 MED ORDER — HYDROMORPHONE HCL 1 MG/ML IJ SOLN
0.5000 mg | INTRAMUSCULAR | Status: DC | PRN
Start: 2015-10-16 — End: 2015-10-18
  Administered 2015-10-16 – 2015-10-18 (×10): 1 mg via INTRAVENOUS
  Filled 2015-10-16 (×11): qty 1

## 2015-10-16 MED ORDER — ONDANSETRON HCL 4 MG/2ML IJ SOLN
4.0000 mg | Freq: Four times a day (QID) | INTRAMUSCULAR | Status: DC | PRN
  Administered 2015-10-17: 4 mg via INTRAVENOUS
  Filled 2015-10-16: qty 2

## 2015-10-16 MED ORDER — HYDROMORPHONE HCL 1 MG/ML IJ SOLN
1.0000 mg | Freq: Once | INTRAMUSCULAR | Status: AC
  Administered 2015-10-16: 1 mg via INTRAVENOUS
  Filled 2015-10-16: qty 1

## 2015-10-16 MED ORDER — ONDANSETRON HCL 4 MG/2ML IJ SOLN
4.0000 mg | Freq: Once | INTRAMUSCULAR | Status: AC
  Administered 2015-10-16: 4 mg via INTRAVENOUS
  Filled 2015-10-16: qty 2

## 2015-10-16 MED ORDER — HEPARIN SODIUM (PORCINE) 5000 UNIT/ML IJ SOLN
5000.0000 [IU] | Freq: Three times a day (TID) | INTRAMUSCULAR | Status: DC
  Administered 2015-10-16 – 2015-10-18 (×4): 5000 [IU] via SUBCUTANEOUS
  Filled 2015-10-16 (×8): qty 1

## 2015-10-16 MED ORDER — POTASSIUM CHLORIDE IN NACL 20-0.9 MEQ/L-% IV SOLN
INTRAVENOUS | Status: DC
  Administered 2015-10-16 – 2015-10-18 (×3): via INTRAVENOUS
  Filled 2015-10-16 (×5): qty 1000

## 2015-10-16 MED ORDER — ONDANSETRON 4 MG PO TBDP: 4.0000 mg | ORAL_TABLET | Freq: Four times a day (QID) | ORAL | Status: DC | PRN

## 2015-10-16 MED ORDER — DIPHENHYDRAMINE HCL 25 MG PO CAPS
25.0000 mg | ORAL_CAPSULE | Freq: Four times a day (QID) | ORAL | Status: DC | PRN
  Administered 2015-10-16: 25 mg via ORAL
  Filled 2015-10-16: qty 1

## 2015-10-16 MED ORDER — DIPHENHYDRAMINE HCL 50 MG/ML IJ SOLN: 25.0000 mg | Freq: Four times a day (QID) | INTRAMUSCULAR | Status: DC | PRN

## 2015-10-16 MED ORDER — DEXTROSE 5 % IV SOLN
2.0000 g | Freq: Once | INTRAVENOUS | Status: AC
  Administered 2015-10-16: 2 g via INTRAVENOUS
  Filled 2015-10-16: qty 2

## 2015-10-16 MED ORDER — CEFTRIAXONE SODIUM 2 G IJ SOLR
2.0000 g | INTRAMUSCULAR | Status: DC
  Administered 2015-10-17: 2 g via INTRAVENOUS
  Filled 2015-10-16 (×2): qty 2

## 2015-10-16 NOTE — ED Provider Notes (Signed)
CSN: [id number]     Arrival date & time 10/16/15  1135 History   First MD Initiated Contact with Patient 10/16/15 1223     Chief Complaint  Patient presents with  . Abdominal Pain     (Consider location/radiation/quality/duration/timing/severity/associated sxs/prior Treatment) HPI 35 year old female who presents with abdominal pain. History of fibroids and CS. Pain localized in upper abdomen, woke her up from sleep this morning. Reports nausea but no vomiting. No diarrhea, melena or hematuria, dysuria, urinary frequency, fever or chills. Pain does seem to flare up after eating. Last night with [place] sandwich and lemonade.   Past Medical History  Diagnosis Date  . Fibroids     microscopic  . Pregnancy induced hypertension   . Headache(784.0)   . Chicken pox    Past Surgical History  Procedure Laterality Date  . Cesarean section  03/13/2011    Procedure: CESAREAN SECTION;  Surgeon: Shon Millet II;  Location: [facility];  Service: Gynecology;  Laterality: N/A;   Family History  Problem Relation Age of Onset  . Hypertension Father   . Cancer Father     prostate  . Stroke Maternal Grandmother   . Cancer Paternal Grandmother     colon  . Anesthesia problems Neg Hx   . Hypotension Neg Hx   . Malignant hyperthermia Neg Hx   . Pseudochol deficiency Neg Hx    Social History  Substance Use Topics  . Smoking status: Never Smoker   . Smokeless tobacco: Never Used  . Alcohol Use: No   OB History    Gravida Para Term Preterm AB TAB SAB Ectopic Multiple Living   1 1 1       1      Review of Systems 10/14 systems reviewed and are negative other than those stated in the HPI    Allergies  Review of patient's allergies indicates no known allergies.  Home Medications   Prior to Admission medications   Medication Sig Start Date End Date Taking? Authorizing Provider  norgestimate-ethinyl estradiol (ORTHO-CYCLEN,SPRINTEC,PREVIFEM) 0.25-35 MG-MCG tablet Take 1 tablet by mouth  daily. Reported on 05/14/2015    Historical Provider, MD   BP 136/89 mmHg  Pulse 69  Temp(Src) 98.7 F (37.1 C) (Oral)  Resp 18  Ht 6' (1.829 m)  Wt 220 lb (99.791 kg)  BMI 29.83 kg/m2  SpO2 96%  LMP 09/09/2015 Physical Exam Physical Exam  Nursing note and vitals reviewed. Constitutional: Well developed, well nourished, non-toxic, and in no acute distress Head: Normocephalic and atraumatic.  Mouth/Throat: Oropharynx is clear and moist.  Neck: Normal range of motion. Neck supple.  Cardiovascular: Normal rate and regular rhythm.   Pulmonary/Chest: Effort normal and breath sounds normal.  Abdominal: Soft. There is upper abdominal and RUQ tenderness. There is no rebound and no guarding.  Musculoskeletal: Normal range of motion.  Neurological: Alert, no facial droop, fluent speech, moves all extremities symmetrically Skin: Skin is warm and dry.  Psychiatric: Cooperative  ED Course  Procedures (including critical care time) Labs Review Labs Reviewed  COMPREHENSIVE METABOLIC PANEL - Abnormal; Notable for the following:    Potassium 3.3 (*)    Glucose, Bld 121 (*)    Calcium 8.6 (*)    ALT 11 (*)    All other components within normal limits  CBC - Abnormal; Notable for the following:    WBC 11.2 (*)    RBC 3.61 (*)    Hemoglobin 11.0 (*)    HCT 33.3 (*)  Platelets 447 (*)    All other components within normal limits  URINALYSIS, ROUTINE W REFLEX MICROSCOPIC (NOT AT [facility]) - Abnormal; Notable for the following:    APPearance CLOUDY (*)    All other components within normal limits  DIFFERENTIAL - Abnormal; Notable for the following:    Neutro Abs 9.2 (*)    All other components within normal limits  LIPASE, BLOOD  PREGNANCY, URINE    Imaging Review US Abdomen Limited Ruq  10/16/2015  CLINICAL DATA:  Right upper quadrant pain, nausea for 5 weeks EXAM: US ABDOMEN LIMITED - RIGHT UPPER QUADRANT COMPARISON:  None. FINDINGS: Gallbladder: Multiple mobile gallstones are noted  within gallbladder the largest measures 1.6 cm. Non mobile gallstone in gallbladder neck region measures 1 cm. Borderline thickening of gallbladder wall up to 3 mm. Although there is no sonographic Murphy's sign early cholecystitis cannot be excluded. Clinical correlation is necessary. Common bile duct: Diameter: 3 mm in diameter within normal limits. Liver: No focal lesion identified. Within normal limits in parenchymal echogenicity. IMPRESSION: Multiple mobile gallstones are noted within gallbladder the largest measures 1.6 cm. Non mobile gallstone in gallbladder neck region measures 1 cm. Borderline thickening of gallbladder wall up to 3 mm. Although there is no sonographic Murphy's sign early cholecystitis cannot be excluded. Clinical correlation is necessary. Normal CBD. Electronically Signed   By: Lahoma Crocker M.D.   On: 10/16/2015 13:56   I have personally reviewed and evaluated these images and lab results as part of my medical decision-making.   EKG Interpretation None      MDM   Final diagnoses:  RUQ pain  Biliary colic   35 year old female who presents with upper abdominal pain since early this morning. She is nontoxic in no acute distress. Afebrile and hemodynamically stable. Soft and non-peritoneal abdomen. With upper abdominal tenderness, but significant right upper quadrant tenderness to palpation. Normal lipase, liver function testing. Blood work notable for minimal leukocytosis of 11.4. Right upper quadrant ultrasound showed evidence of multiple gallstones. There is a non-mobile gallstone in the gallbladder neck with borderline gallbladder wall thickening. No pericholecystic fluid or sonographic Murphy sign. Although pain slightly improved after Dilaudid, she still has persistent symptoms after repeat narcotic medications. Concern for persistent biliary colic versus early cholecystitis. Discussed with Dr. Kieth Brightly from general surgery. He will admit patient at [facility] for  potential cholecystectomy. 2 g of ceftriaxone administered for potential early cholecystitis.  Forde Dandy, MD 10/16/15 [id number]

## 2015-10-16 NOTE — ED Notes (Signed)
Upper abd pain x 1 month worse last 24 hrs.   Is coming from [facility]

## 2015-10-16 NOTE — ED Notes (Signed)
[facility] is aware of bed 1536 at [facility] for transfer

## 2015-10-16 NOTE — H&P (Signed)
Natalie Kane is an 35 y.o. female.   Chief Complaint: abdominal pain HPI: 35 yo female with 1 month of intermittent RUQ abdominal pain. The pain usually happened after meals but sometimes occurred randomly. Today she woke up with pain that did not go away and went the Er as it did not subside. It was much more intense than previous pain. She did not vomit, but is nauseated. She has no fevers. She denies diarrhea.  Past Medical History  Diagnosis Date  . Fibroids     microscopic  . Pregnancy induced hypertension   . Headache(784.0)   . Chicken pox     Past Surgical History  Procedure Laterality Date  . Cesarean section  03/13/2011    Procedure: CESAREAN SECTION;  Surgeon: Roselle Locus II;  Location: [facility];  Service: Gynecology;  Laterality: N/A;    Family History  Problem Relation Age of Onset  . Hypertension Father   . Cancer Father     prostate  . Stroke Maternal Grandmother   . Cancer Paternal Grandmother     colon  . Anesthesia problems Neg Hx   . Hypotension Neg Hx   . Malignant hyperthermia Neg Hx   . Pseudochol deficiency Neg Hx    Social History:  reports that she has never smoked. She has never used smokeless tobacco. She reports that she does not drink alcohol or use illicit drugs.  Allergies: No Known Allergies  Medications Prior to Admission  Medication Sig Dispense Refill  . norgestimate-ethinyl estradiol (ORTHO-CYCLEN,SPRINTEC,PREVIFEM) 0.25-35 MG-MCG tablet Take 1 tablet by mouth daily. Reported on 05/14/2015    . omeprazole (PRILOSEC) 20 MG capsule Take 20 mg by mouth daily.       Results for orders placed or performed during the hospital encounter of 10/16/15 (from the past 48 hour(s))  Lipase, blood     Status: None   Collection Time: 10/16/15 12:10 PM  Result Value Ref Range   Lipase 14 11 - 51 U/L  Comprehensive metabolic panel     Status: Abnormal   Collection Time: 10/16/15 12:10 PM  Result Value Ref Range   Sodium 135 135 - 145 mmol/L    Potassium 3.3 (L) 3.5 - 5.1 mmol/L   Chloride 101 101 - 111 mmol/L   CO2 26 22 - 32 mmol/L   Glucose, Bld 121 (H) 65 - 99 mg/dL   BUN 7 6 - 20 mg/dL   Creatinine, Ser 3.17 0.44 - 1.00 mg/dL   Calcium 8.6 (L) 8.9 - 10.3 mg/dL   Total Protein 7.4 6.5 - 8.1 g/dL   Albumin 3.7 3.5 - 5.0 g/dL   AST 17 15 - 41 U/L   ALT 11 (L) 14 - 54 U/L   Alkaline Phosphatase 59 38 - 126 U/L   Total Bilirubin 0.4 0.3 - 1.2 mg/dL   GFR calc non Af Amer >60 >60 mL/min   GFR calc Af Amer >60 >60 mL/min    Comment: (NOTE) The eGFR has been calculated using the CKD EPI equation. This calculation has not been validated in all clinical situations. eGFR's persistently <60 mL/min signify possible Chronic Kidney Disease.    Anion gap 8 5 - 15  CBC     Status: Abnormal   Collection Time: 10/16/15 12:10 PM  Result Value Ref Range   WBC 11.2 (H) 4.0 - 10.5 K/uL   RBC 3.61 (L) 3.87 - 5.11 MIL/uL   Hemoglobin 11.0 (L) 12.0 - 15.0 g/dL   HCT 81.8 (L)  36.0 - 46.0 %   MCV 92.2 78.0 - 100.0 fL   MCH 30.5 26.0 - 34.0 pg   MCHC 33.0 30.0 - 36.0 g/dL   RDW 13.0 11.5 - 15.5 %   Platelets 447 (H) 150 - 400 K/uL  Urinalysis, Routine w reflex microscopic     Status: Abnormal   Collection Time: 10/16/15 12:10 PM  Result Value Ref Range   Color, Urine YELLOW YELLOW   APPearance CLOUDY (A) CLEAR   Specific Gravity, Urine 1.025 1.005 - 1.030   pH 7.0 5.0 - 8.0   Glucose, UA NEGATIVE NEGATIVE mg/dL   Hgb urine dipstick NEGATIVE NEGATIVE   Bilirubin Urine NEGATIVE NEGATIVE   Ketones, ur NEGATIVE NEGATIVE mg/dL   Protein, ur NEGATIVE NEGATIVE mg/dL   Nitrite NEGATIVE NEGATIVE   Leukocytes, UA NEGATIVE NEGATIVE    Comment: MICROSCOPIC NOT DONE ON URINES WITH NEGATIVE PROTEIN, BLOOD, LEUKOCYTES, NITRITE, OR GLUCOSE <1000 mg/dL.  Pregnancy, urine     Status: None   Collection Time: 10/16/15 12:10 PM  Result Value Ref Range   Preg Test, Ur NEGATIVE NEGATIVE    Comment:        THE SENSITIVITY OF THIS METHODOLOGY IS >20  mIU/mL.   Differential     Status: Abnormal   Collection Time: 10/16/15 12:10 PM  Result Value Ref Range   Neutrophils Relative % 80 %   Neutro Abs 9.2 (H) 1.7 - 7.7 K/uL   Lymphocytes Relative 14 %   Lymphs Abs 1.6 0.7 - 4.0 K/uL   Monocytes Relative 5 %   Monocytes Absolute 0.6 0.1 - 1.0 K/uL   Eosinophils Relative 1 %   Eosinophils Absolute 0.1 0.0 - 0.7 K/uL   Basophils Relative 0 %   Basophils Absolute 0.0 0.0 - 0.1 K/uL   US Abdomen Limited Ruq  10/16/2015  CLINICAL DATA:  Right upper quadrant pain, nausea for 5 weeks EXAM: US ABDOMEN LIMITED - RIGHT UPPER QUADRANT COMPARISON:  None. FINDINGS: Gallbladder: Multiple mobile gallstones are noted within gallbladder the largest measures 1.6 cm. Non mobile gallstone in gallbladder neck region measures 1 cm. Borderline thickening of gallbladder wall up to 3 mm. Although there is no sonographic Murphy's sign early cholecystitis cannot be excluded. Clinical correlation is necessary. Common bile duct: Diameter: 3 mm in diameter within normal limits. Liver: No focal lesion identified. Within normal limits in parenchymal echogenicity. IMPRESSION: Multiple mobile gallstones are noted within gallbladder the largest measures 1.6 cm. Non mobile gallstone in gallbladder neck region measures 1 cm. Borderline thickening of gallbladder wall up to 3 mm. Although there is no sonographic Murphy's sign early cholecystitis cannot be excluded. Clinical correlation is necessary. Normal CBD. Electronically Signed   By: Lahoma Crocker M.D.   On: 10/16/2015 13:56    Review of Systems  Constitutional: Negative for fever and chills.  HENT: Negative for hearing loss.   Eyes: Negative for blurred vision and double vision.  Respiratory: Negative for cough and hemoptysis.   Cardiovascular: Negative for chest pain and palpitations.  Gastrointestinal: Positive for nausea and abdominal pain. Negative for vomiting.  Genitourinary: Negative for dysuria and urgency.   Musculoskeletal: Negative for myalgias and neck pain.  Skin: Negative for itching and rash.  Neurological: Negative for dizziness, tingling and headaches.  Endo/Heme/Allergies: Does not bruise/bleed easily.  Psychiatric/Behavioral: Negative for depression and suicidal ideas.    Blood pressure 142/91, pulse 75, temperature 98.1 F (36.7 C), temperature source Oral, resp. rate 16, height 6' (1.829 m), weight 99.791 kg (  220 lb), last menstrual period 09/09/2015, SpO2 100 %. Physical Exam  Vitals reviewed. Constitutional: She is oriented to person, place, and time. She appears well-developed and well-nourished.  HENT:  Head: Normocephalic and atraumatic.  Eyes: Conjunctivae and EOM are normal. Pupils are equal, round, and reactive to light.  Neck: Normal range of motion. Neck supple.  Cardiovascular: Normal rate and regular rhythm.   Respiratory: Effort normal and breath sounds normal.  GI: Soft. Bowel sounds are normal. She exhibits no distension. There is tenderness in the right upper quadrant. There is no guarding and negative Murphy's sign.  Musculoskeletal: Normal range of motion.  Neurological: She is alert and oriented to person, place, and time.  Skin: Skin is warm and dry.  Psychiatric: She has a normal mood and affect. Her behavior is normal.     Assessment/Plan 35 yo female with acute cholecystitis by [place] and leukocytosis. -IV ABX -OR in am for lap chole -clears tonight -pain control  Mickeal Skinner, MD 10/16/2015, 5:27 PM

## 2015-10-17 ENCOUNTER — Encounter (HOSPITAL_COMMUNITY): Payer: Self-pay | Admitting: Anesthesiology

## 2015-10-17 ENCOUNTER — Inpatient Hospital Stay (HOSPITAL_COMMUNITY): Payer: BC Managed Care – PPO | Admitting: Certified Registered Nurse Anesthetist

## 2015-10-17 ENCOUNTER — Encounter (HOSPITAL_COMMUNITY)
Admission: EM | Disposition: A | Discharge status: Home / Self Care | Payer: Self-pay | Source: Home / Self Care | Attending: Emergency Medicine

## 2015-10-17 DIAGNOSIS — K8050 Calculus of bile duct without cholangitis or cholecystitis without obstruction: Secondary | ICD-10-CM | POA: Diagnosis present

## 2015-10-17 HISTORY — PX: CHOLECYSTECTOMY: SHX55

## 2015-10-17 LAB — COMPREHENSIVE METABOLIC PANEL
ALT: 10 U/L — ABNORMAL LOW (ref 14–54)
AST: 12 U/L — ABNORMAL LOW (ref 15–41)
Albumin: 3.2 g/dL — ABNORMAL LOW (ref 3.5–5.0)
Alkaline Phosphatase: 54 U/L (ref 38–126)
Anion gap: 4 — ABNORMAL LOW (ref 5–15)
BUN: <5 mg/dL — ABNORMAL LOW (ref 6–20)
CO2: 27 mmol/L (ref 22–32)
Calcium: 8.1 mg/dL — ABNORMAL LOW (ref 8.9–10.3)
Chloride: 107 mmol/L (ref 101–111)
Creatinine, Ser: 0.66 mg/dL (ref 0.44–1.00)
GFR calc Af Amer: >60 mL/min (ref >60)
GFR calc non Af Amer: >60 mL/min (ref >60)
Glucose, Bld: 101 mg/dL — ABNORMAL HIGH (ref 65–99)
Potassium: 3.4 mmol/L — ABNORMAL LOW (ref 3.5–5.1)
Sodium: 138 mmol/L (ref 135–145)
Total Bilirubin: 0.6 mg/dL (ref 0.3–1.2)
Total Protein: 6.4 g/dL — ABNORMAL LOW (ref 6.5–8.1)

## 2015-10-17 LAB — CBC
HCT: 31.2 % — ABNORMAL LOW (ref 36.0–46.0)
Hemoglobin: 10.2 g/dL — ABNORMAL LOW (ref 12.0–15.0)
MCH: 29.7 pg (ref 26.0–34.0)
MCHC: 32.7 g/dL (ref 30.0–36.0)
MCV: 91 fL (ref 78.0–100.0)
Platelets: 395 10*3/uL (ref 150–400)
RBC: 3.43 MIL/uL — ABNORMAL LOW (ref 3.87–5.11)
RDW: 13.8 % (ref 11.5–15.5)
WBC: 11.1 10*3/uL — ABNORMAL HIGH (ref 4.0–10.5)

## 2015-10-17 LAB — LIPASE, BLOOD: Lipase: 13 U/L (ref 11–51)

## 2015-10-17 SURGERY — LAPAROSCOPIC CHOLECYSTECTOMY
Anesthesia: General

## 2015-10-17 MED ORDER — DEXAMETHASONE SODIUM PHOSPHATE 10 MG/ML IJ SOLN
INTRAMUSCULAR | Status: AC
  Filled 2015-10-17: qty 1

## 2015-10-17 MED ORDER — BUPIVACAINE-EPINEPHRINE 0.25% -1:200000 IJ SOLN
INTRAMUSCULAR | Status: DC | PRN
  Administered 2015-10-17: 30 mL

## 2015-10-17 MED ORDER — HYDROCODONE-ACETAMINOPHEN 5-325 MG PO TABS: 1.0000 | ORAL_TABLET | ORAL | Status: DC | PRN

## 2015-10-17 MED ORDER — LIDOCAINE HCL (CARDIAC) 20 MG/ML IV SOLN
INTRAVENOUS | Status: DC | PRN
  Administered 2015-10-17: 100 mg via INTRAVENOUS

## 2015-10-17 MED ORDER — CEFAZOLIN SODIUM-DEXTROSE 2-4 GM/100ML-% IV SOLN
2.0000 g | INTRAVENOUS | Status: AC
  Administered 2015-10-17: 2 g via INTRAVENOUS

## 2015-10-17 MED ORDER — BUPIVACAINE-EPINEPHRINE 0.25% -1:200000 IJ SOLN
INTRAMUSCULAR | Status: AC
  Filled 2015-10-17: qty 1

## 2015-10-17 MED ORDER — LIDOCAINE HCL (CARDIAC) 20 MG/ML IV SOLN
INTRAVENOUS | Status: AC
  Filled 2015-10-17: qty 5

## 2015-10-17 MED ORDER — MIDAZOLAM HCL 2 MG/2ML IJ SOLN
INTRAMUSCULAR | Status: AC
  Filled 2015-10-17: qty 2

## 2015-10-17 MED ORDER — EPHEDRINE SULFATE 50 MG/ML IJ SOLN
INTRAMUSCULAR | Status: AC
  Filled 2015-10-17: qty 1

## 2015-10-17 MED ORDER — PROMETHAZINE HCL 25 MG/ML IJ SOLN: 6.2500 mg | INTRAMUSCULAR | Status: DC | PRN

## 2015-10-17 MED ORDER — FENTANYL CITRATE (PF) 250 MCG/5ML IJ SOLN
INTRAMUSCULAR | Status: AC
  Filled 2015-10-17: qty 5

## 2015-10-17 MED ORDER — 0.9 % SODIUM CHLORIDE (POUR BTL) OPTIME
TOPICAL | Status: DC | PRN
  Administered 2015-10-17: 1000 mL

## 2015-10-17 MED ORDER — CEFAZOLIN SODIUM-DEXTROSE 2-4 GM/100ML-% IV SOLN
INTRAVENOUS | Status: AC
  Filled 2015-10-17: qty 100

## 2015-10-17 MED ORDER — LACTATED RINGERS IR SOLN
Status: DC | PRN
  Administered 2015-10-17: 1

## 2015-10-17 MED ORDER — MIDAZOLAM HCL 5 MG/5ML IJ SOLN
INTRAMUSCULAR | Status: DC | PRN
  Administered 2015-10-17: 2 mg via INTRAVENOUS

## 2015-10-17 MED ORDER — HYDROCODONE-ACETAMINOPHEN 5-325 MG PO TABS
1.0000 | ORAL_TABLET | ORAL | Status: DC | PRN
  Administered 2015-10-17 – 2015-10-18 (×2): 1 via ORAL
  Filled 2015-10-17 (×3): qty 1

## 2015-10-17 MED ORDER — NEOSTIGMINE METHYLSULFATE 10 MG/10ML IV SOLN
INTRAVENOUS | Status: DC | PRN
  Administered 2015-10-17: 5 mg via INTRAVENOUS

## 2015-10-17 MED ORDER — FENTANYL CITRATE (PF) 100 MCG/2ML IJ SOLN
INTRAMUSCULAR | Status: DC | PRN
  Administered 2015-10-17 (×2): 50 ug via INTRAVENOUS
  Administered 2015-10-17: 25 ug via INTRAVENOUS
  Administered 2015-10-17: 50 ug via INTRAVENOUS

## 2015-10-17 MED ORDER — ROCURONIUM BROMIDE 100 MG/10ML IV SOLN
INTRAVENOUS | Status: AC
  Filled 2015-10-17: qty 1

## 2015-10-17 MED ORDER — ROCURONIUM BROMIDE 100 MG/10ML IV SOLN
INTRAVENOUS | Status: DC | PRN
  Administered 2015-10-17: 30 mg via INTRAVENOUS

## 2015-10-17 MED ORDER — GLYCOPYRROLATE 0.2 MG/ML IJ SOLN
INTRAMUSCULAR | Status: DC | PRN
  Administered 2015-10-17: 1 mg via INTRAVENOUS

## 2015-10-17 MED ORDER — HYDROMORPHONE HCL 1 MG/ML IJ SOLN
0.2500 mg | INTRAMUSCULAR | Status: DC | PRN
  Administered 2015-10-17 (×2): 0.5 mg via INTRAVENOUS

## 2015-10-17 MED ORDER — KETOROLAC TROMETHAMINE 30 MG/ML IJ SOLN
INTRAMUSCULAR | Status: DC | PRN
  Administered 2015-10-17: 30 mg via INTRAVENOUS

## 2015-10-17 MED ORDER — ONDANSETRON HCL 4 MG/2ML IJ SOLN
INTRAMUSCULAR | Status: AC
  Filled 2015-10-17: qty 2

## 2015-10-17 MED ORDER — PROPOFOL 10 MG/ML IV BOLUS
INTRAVENOUS | Status: DC | PRN
  Administered 2015-10-17: 100 mg via INTRAVENOUS

## 2015-10-17 MED ORDER — SUCCINYLCHOLINE CHLORIDE 20 MG/ML IJ SOLN
INTRAMUSCULAR | Status: DC | PRN
  Administered 2015-10-17: 100 mg via INTRAVENOUS

## 2015-10-17 MED ORDER — LACTATED RINGERS IV SOLN
INTRAVENOUS | Status: DC | PRN
  Administered 2015-10-17 (×2): via INTRAVENOUS

## 2015-10-17 MED ORDER — PROPOFOL 10 MG/ML IV BOLUS
INTRAVENOUS | Status: AC
  Filled 2015-10-17: qty 20

## 2015-10-17 MED ORDER — DEXAMETHASONE SODIUM PHOSPHATE 10 MG/ML IJ SOLN
INTRAMUSCULAR | Status: DC | PRN
  Administered 2015-10-17: 10 mg via INTRAVENOUS

## 2015-10-17 MED ORDER — KETOROLAC TROMETHAMINE 30 MG/ML IJ SOLN
INTRAMUSCULAR | Status: AC
  Filled 2015-10-17: qty 1

## 2015-10-17 MED ORDER — HYDROMORPHONE HCL 1 MG/ML IJ SOLN
INTRAMUSCULAR | Status: AC
  Filled 2015-10-17: qty 1

## 2015-10-17 MED ORDER — IOPAMIDOL (ISOVUE-300) INJECTION 61%
INTRAVENOUS | Status: AC
  Filled 2015-10-17: qty 50

## 2015-10-17 MED ORDER — SODIUM CHLORIDE 0.9 % IJ SOLN
INTRAMUSCULAR | Status: AC
  Filled 2015-10-17: qty 10

## 2015-10-17 SURGICAL SUPPLY — 46 items
APPLIER CLIP ROT 10 11.4 M/L (STAPLE)
APR CLP MED LRG 11.4X10 (STAPLE)
BAG SPEC RTRVL 10 TROC 200 (ENDOMECHANICALS) ×1
CABLE HIGH FREQUENCY MONO STRZ (ELECTRODE) ×2 IMPLANT
CATH CHOLANG 76X19 KUMAR (CATHETERS) ×2 IMPLANT
CHLORAPREP W/TINT 26ML (MISCELLANEOUS) ×2 IMPLANT
CLIP APPLIE ROT 10 11.4 M/L (STAPLE) IMPLANT
CLIP LIGATING HEM O LOK PURPLE (MISCELLANEOUS) ×4 IMPLANT
CLIP LIGATING HEMO LOK XL GOLD (MISCELLANEOUS) IMPLANT
COVER MAYO STAND STRL (DRAPES) IMPLANT
COVER SURGICAL LIGHT HANDLE (MISCELLANEOUS) ×2 IMPLANT
CUTTER FLEX LINEAR 45M (STAPLE) IMPLANT
DECANTER SPIKE VIAL GLASS SM (MISCELLANEOUS) ×2 IMPLANT
DEVICE PMI PUNCTURE CLOSURE (MISCELLANEOUS) ×2 IMPLANT
DRAIN CHANNEL 19F RND (DRAIN) IMPLANT
DRAPE C-ARM 42X120 X-RAY (DRAPES) IMPLANT
DRAPE LAPAROSCOPIC ABDOMINAL (DRAPES) ×2 IMPLANT
EVACUATOR SILICONE 100CC (DRAIN) IMPLANT
GLOVE BIOGEL PI IND STRL 7.0 (GLOVE) ×1 IMPLANT
GLOVE BIOGEL PI INDICATOR 7.0 (GLOVE) ×1
GLOVE SURG SS PI 7.0 STRL IVOR (GLOVE) ×2 IMPLANT
GOWN STRL REUS W/TWL LRG LVL3 (GOWN DISPOSABLE) ×2 IMPLANT
GOWN STRL REUS W/TWL XL LVL3 (GOWN DISPOSABLE) ×4 IMPLANT
KIT BASIN OR (CUSTOM PROCEDURE TRAY) ×2 IMPLANT
LIQUID BAND (GAUZE/BANDAGES/DRESSINGS) ×2 IMPLANT
PAD POSITIONING PINK XL (MISCELLANEOUS) IMPLANT
POUCH RETRIEVAL ECOSAC 10 (ENDOMECHANICALS) ×1 IMPLANT
POUCH RETRIEVAL ECOSAC 10MM (ENDOMECHANICALS) ×1
RELOAD 45 VASCULAR/THIN (ENDOMECHANICALS) IMPLANT
RELOAD STAPLE 45 2.5 WHT GRN (ENDOMECHANICALS) IMPLANT
RELOAD STAPLE 45 3.5 BLU ETS (ENDOMECHANICALS) IMPLANT
RELOAD STAPLE TA45 3.5 REG BLU (ENDOMECHANICALS) IMPLANT
SCISSORS LAP 5X35 DISP (ENDOMECHANICALS) ×2 IMPLANT
SET IRRIG TUBING LAPAROSCOPIC (IRRIGATION / IRRIGATOR) IMPLANT
SHEARS HARMONIC ACE PLUS 36CM (ENDOMECHANICALS) IMPLANT
SLEEVE XCEL OPT CAN 5 100 (ENDOMECHANICALS) ×4 IMPLANT
STOPCOCK 4 WAY LG BORE MALE ST (IV SETS) ×2 IMPLANT
SUT ETHILON 2 0 PS N (SUTURE) IMPLANT
SUT MNCRL AB 4-0 PS2 18 (SUTURE) ×2 IMPLANT
SUT VICRYL 0 ENDOLOOP (SUTURE) IMPLANT
TOWEL OR 17X26 10 PK STRL BLUE (TOWEL DISPOSABLE) ×2 IMPLANT
TOWEL OR NON WOVEN STRL DISP B (DISPOSABLE) IMPLANT
TRAY LAPAROSCOPIC (CUSTOM PROCEDURE TRAY) ×2 IMPLANT
TROCAR BLADELESS OPT 5 100 (ENDOMECHANICALS) ×2 IMPLANT
TROCAR XCEL 12X100 BLDLESS (ENDOMECHANICALS) ×2 IMPLANT
TUBING INSUF HEATED (TUBING) IMPLANT

## 2015-10-17 NOTE — Anesthesia Preprocedure Evaluation (Signed)
Anesthesia Evaluation  Patient identified by MRN, date of birth, ID band Patient awake    Reviewed: Allergy & Precautions, NPO status , Patient's Chart, lab work & pertinent test results  Airway Mallampati: II  TM Distance: >3 FB Neck ROM: Full    Dental no notable dental hx.    Pulmonary neg pulmonary ROS,    Pulmonary exam normal breath sounds clear to auscultation       Cardiovascular hypertension, Normal cardiovascular exam Rhythm:Regular Rate:Normal     Neuro/Psych  Headaches, negative psych ROS   GI/Hepatic negative GI ROS, Neg liver ROS,   Endo/Other  negative endocrine ROS  Renal/GU negative Renal ROS  negative genitourinary   Musculoskeletal negative musculoskeletal ROS (+)   Abdominal   Peds negative pediatric ROS (+)  Hematology negative hematology ROS (+)   Anesthesia Other Findings   Reproductive/Obstetrics negative OB ROS                             Anesthesia Physical Anesthesia Plan  ASA: II  Anesthesia Plan: General   Post-op Pain Management:    Induction: Intravenous  Airway Management Planned: Oral ETT  Additional Equipment:   Intra-op Plan:   Post-operative Plan: Extubation in OR  Informed Consent: I have reviewed the patients History and Physical, chart, labs and discussed the procedure including the risks, benefits and alternatives for the proposed anesthesia with the patient or authorized representative who has indicated his/her understanding and acceptance.   Dental advisory given  Plan Discussed with: CRNA  Anesthesia Plan Comments:         Anesthesia Quick Evaluation

## 2015-10-17 NOTE — Anesthesia Procedure Notes (Signed)
Procedure Name: Intubation Date/Time: 10/17/2015 8:52 AM Performed by: West Pugh Pre-anesthesia Checklist: Patient identified, Emergency Drugs available, Suction available, Patient being monitored and Timeout performed Patient Re-evaluated:Patient Re-evaluated prior to inductionOxygen Delivery Method: Circle system utilized Preoxygenation: Pre-oxygenation with 100% oxygen Intubation Type: IV induction, Cricoid Pressure applied and Rapid sequence Ventilation: Mask ventilation without difficulty Laryngoscope Size: Mac and 4 Grade View: Grade I Tube type: Oral Number of attempts: 1 Airway Equipment and Method: Stylet Placement Confirmation: ETT inserted through vocal cords under direct vision,  positive ETCO2,  CO2 detector and breath sounds checked- equal and bilateral Secured at: 22 cm Tube secured with: Tape Dental Injury: Teeth and Oropharynx as per pre-operative assessment

## 2015-10-17 NOTE — Discharge Instructions (Signed)

## 2015-10-17 NOTE — Op Note (Signed)
PATIENT:  Natalie Kane  35 y.o. female  PRE-OPERATIVE DIAGNOSIS:  cholecystitis  POST-OPERATIVE DIAGNOSIS:  cholecystitis  PROCEDURE:  Procedure(s): LAPAROSCOPIC CHOLECYSTECTOMY   SURGEON:  Surgeon(s): Arta Bruce Darrill Vreeland, MD  ASSISTANT: none  ANESTHESIA:   local and general  Indications for procedure: [name] is a 35 y.o. female with symptoms of Abdominal pain and RUQ pain consistent with gallbladder disease, Confirmed by Ultrasound.  Description of procedure: The patient was brought into the operative suite, placed supine. Anesthesia was administered with endotracheal tube. Patient was strapped in place and foot board was secured. All pressure points were offloaded by foam padding. The patient was prepped and draped in the usual sterile fashion.  A small incision was made to the right of the umbilicus. A 49mm trocar was inserted into the peritoneal cavity with optical entry. Pneumoperitoneum was applied with high flow low pressure. 2 35mm trocars were placed in the RUQ. A 68mm trocar was placed in the subxiphoid space. All trocars sites were first anesthesized with 0.25% marcaine with epinephrine in the subcutaneous and preperitoneal layers. Next the patient was placed in reverse trendelenberg. The gallbladder was tense and therefore drained with a suction catheter in the mid body releasing a moderate amount of green fluid.  The gallbladder was retracted cephalad and lateral. The peritoneum was reflected off the infundibulum working lateral to medial. The cystic duct and cystic artery were identified and further dissection revealed a critical view.  The cystic duct and cystic artery were doubly clipped and ligated.   The gallbladder was removed off the liver bed with cautery. The Gallbladder was placed in a specimen bag. The gallbladder fossa was irrigated and hemostasis was applied with cautery. The gallbladder was removed via the 29mm trocar. The fascial defect was closed  with interrupted 0 vicryl suture via laparoscopic trans-fascial suture passer. Pneumoperitoneum was removed, all trocar were removed. All incisions were closed with 4-0 monocryl subcuticular stitch. The patient woke from anesthesia and was brought to PACU in stable condition. All counts were correct  Findings: inflamed gallbladder  Specimen: gallbladder  Blood loss: Total I/O In: -  Out: 50 [Blood:50] ml  Local anesthesia: 30 ml 0.25% marcaine with epinephrine  Complications: none  PLAN OF CARE: Admit for overnight observation  PATIENT DISPOSITION:  PACU - hemodynamically stable.  Gurney Maxin, M.D. General, Bariatric, & Minimally Invasive Surgery [facility]

## 2015-10-17 NOTE — Transfer of Care (Signed)
Immediate Anesthesia Transfer of Care Note  Patient: Natalie Kane  Procedure(s) Performed: Procedure(s): LAPAROSCOPIC CHOLECYSTECTOMY (N/A)  Patient Location: PACU  Anesthesia Type:General  Level of Consciousness:  sedated, patient cooperative and responds to stimulation  Airway & Oxygen Therapy:Patient Spontanous Breathing and Patient connected to face mask oxgen  Post-op Assessment:  Report given to PACU RN and Post -op Vital signs reviewed and stable  Post vital signs:  Reviewed and stable  Last Vitals:  Filed Vitals:   10/17/15 1009 10/17/15 1015  BP: 146/81 132/83  Pulse: 75 73  Temp: 36.9 C   Resp: 18 9    Complications: No apparent anesthesia complications

## 2015-10-17 NOTE — Anesthesia Postprocedure Evaluation (Signed)
Anesthesia Post Note  Patient: Natalie Kane  Procedure(s) Performed: Procedure(s) (LRB): LAPAROSCOPIC CHOLECYSTECTOMY (N/A)  Patient location during evaluation: PACU Anesthesia Type: General Level of consciousness: awake and alert Pain management: pain level controlled Vital Signs Assessment: post-procedure vital signs reviewed and stable Respiratory status: spontaneous breathing, nonlabored ventilation, respiratory function stable and patient connected to nasal cannula oxygen Cardiovascular status: blood pressure returned to baseline and stable Postop Assessment: no signs of nausea or vomiting Anesthetic complications: no    Last Vitals:  Filed Vitals:   10/17/15 1300 10/17/15 1417  BP: 142/76 144/64  Pulse: 75 65  Temp: 36.8 C 36.8 C  Resp: 16 16    Last Pain:  Filed Vitals:   10/17/15 1418  PainSc: Asleep                 Darrell Leonhardt J

## 2015-10-18 ENCOUNTER — Encounter (HOSPITAL_COMMUNITY): Payer: Self-pay | Admitting: General Surgery

## 2015-10-18 ENCOUNTER — Encounter: Payer: BC Managed Care – PPO | Admitting: Family

## 2015-10-18 NOTE — Discharge Summary (Signed)
  Physician Discharge Summary  Patient ID: Natalie Kane MRN: [id number] DOB/AGE: 07/25/1980 35 y.o.  Admit date: 10/16/2015 Discharge date: 10/18/2015  Admitting Diagnosis: Acute cholecystitis   Discharge Diagnosis Patient Active Problem List   Diagnosis Date Noted  . Biliary colic 123456  . Cholelithiasis with cholecystitis without obstruction 10/16/2015  . Hidradenitis suppurativa of left axilla 05/14/2015  . Superficial laceration 05/14/2015  . Subconjunctival hemorrhage of right eye 05/14/2015  . PIH (pregnancy induced hypertension) 03/13/2011    Consultants none  Imaging: US Abdomen Limited Ruq  10/16/2015  CLINICAL DATA:  Right upper quadrant pain, nausea for 5 weeks EXAM: US ABDOMEN LIMITED - RIGHT UPPER QUADRANT COMPARISON:  None. FINDINGS: Gallbladder: Multiple mobile gallstones are noted within gallbladder the largest measures 1.6 cm. Non mobile gallstone in gallbladder neck region measures 1 cm. Borderline thickening of gallbladder wall up to 3 mm. Although there is no sonographic Murphy's sign early cholecystitis cannot be excluded. Clinical correlation is necessary. Common bile duct: Diameter: 3 mm in diameter within normal limits. Liver: No focal lesion identified. Within normal limits in parenchymal echogenicity. IMPRESSION: Multiple mobile gallstones are noted within gallbladder the largest measures 1.6 cm. Non mobile gallstone in gallbladder neck region measures 1 cm. Borderline thickening of gallbladder wall up to 3 mm. Although there is no sonographic Murphy's sign early cholecystitis cannot be excluded. Clinical correlation is necessary. Normal CBD. Electronically Signed   By: Lahoma Crocker M.D.   On: 10/16/2015 13:56    Procedures Laparoscopic cholecystectomy--dr. Kieth Brightly 10/17/15  Hospital Course:  Natalie Kane is a healthy 35 year old female who presented to [facility] with abdominal pain.  Workup showed acute cholecystitis.  Patient was admitted and underwent  procedure listed above.  Tolerated procedure well and was transferred to the floor.  Diet was advanced as tolerated.  On POD#1, the patient was voiding well, tolerating diet, ambulating well, pain well controlled, vital signs stable, incisions c/d/i and felt stable for discharge home.  Medication risks, benefits and therapeutic alternatives were reviewed with the patient.  She verbalizes understanding.  Patient will follow up in our office in 3 weeks and knows to call with questions or concerns.  Physical Exam: General:  Alert, NAD, pleasant, comfortable Abd:  Soft, ND, mild tenderness, incisions C/D/I    Medication List    TAKE these medications        HYDROcodone-acetaminophen 5-325 MG tablet  Commonly known as:  NORCO/VICODIN  Take 1-2 tablets by mouth every 4 (four) hours as needed for moderate pain.     norgestimate-ethinyl estradiol 0.25-35 MG-MCG tablet  Commonly known as:  ORTHO-CYCLEN,SPRINTEC,PREVIFEM  Take 1 tablet by mouth daily. Reported on 05/14/2015     omeprazole 20 MG capsule  Commonly known as:  PRILOSEC  Take 20 mg by mouth daily.             Follow-up Information    Follow up with [facility] 10/31/2015.   Specialty:  General Surgery   Why:  arrive by10:30AM for a 11AM post op check   Contact information:   [address] [place] [address] [phone]       Signed: Erby Pian, [facility] [phone]  10/18/2015, 9:53 AM

## 2015-10-18 NOTE — Progress Notes (Signed)
Pt IV removed. Pain controlled. Printed paper prescription provided to patient at bedside. AVS, medications and follow up appt reviewed at bedside. No further questions. Pt stable for d/c home. No needs. Kizzie Ide, RN

## 2015-11-08 ENCOUNTER — Encounter: Payer: BC Managed Care – PPO | Admitting: Family

## 2015-12-04 ENCOUNTER — Other Ambulatory Visit (HOSPITAL_COMMUNITY)
Admission: RE | Admit: 2015-12-04 | Discharge: 2015-12-04 | Disposition: A | Discharge status: Home / Self Care | Payer: BC Managed Care – PPO | Source: Ambulatory Visit | Attending: Family | Admitting: Family

## 2015-12-04 ENCOUNTER — Encounter: Payer: Self-pay | Admitting: Family

## 2015-12-04 ENCOUNTER — Ambulatory Visit (INDEPENDENT_AMBULATORY_CARE_PROVIDER_SITE_OTHER): Payer: BC Managed Care – PPO | Admitting: Family

## 2015-12-04 VITALS — BP 130/70 | HR 78 | Temp 98.0°F | Resp 18 | Ht 72.0 in | Wt 244.2 lb

## 2015-12-04 DIAGNOSIS — Z01419 Encounter for gynecological examination (general) (routine) without abnormal findings: Secondary | ICD-10-CM

## 2015-12-04 DIAGNOSIS — Z113 Encounter for screening for infections with a predominantly sexual mode of transmission: Secondary | ICD-10-CM | POA: Diagnosis present

## 2015-12-04 DIAGNOSIS — Z1151 Encounter for screening for human papillomavirus (HPV): Secondary | ICD-10-CM | POA: Insufficient documentation

## 2015-12-04 DIAGNOSIS — Z114 Encounter for screening for human immunodeficiency virus [HIV]: Secondary | ICD-10-CM

## 2015-12-04 DIAGNOSIS — Z0000 Encounter for general adult medical examination without abnormal findings: Secondary | ICD-10-CM

## 2015-12-04 DIAGNOSIS — N760 Acute vaginitis: Secondary | ICD-10-CM | POA: Diagnosis present

## 2015-12-04 DIAGNOSIS — Z23 Encounter for immunization: Secondary | ICD-10-CM | POA: Diagnosis not present

## 2015-12-04 LAB — URINALYSIS, ROUTINE W REFLEX MICROSCOPIC
Bilirubin Urine: NEGATIVE
Hgb urine dipstick: NEGATIVE
Ketones, ur: NEGATIVE
Leukocytes, UA: NEGATIVE
Nitrite: NEGATIVE
Specific Gravity, Urine: 1.02 (ref 1.000–1.030)
Total Protein, Urine: NEGATIVE
Urine Glucose: NEGATIVE
Urobilinogen, UA: 0.2 (ref 0.0–1.0)
pH: 6 (ref 5.0–8.0)

## 2015-12-04 LAB — BASIC METABOLIC PANEL
BUN: 9 mg/dL (ref 6–23)
CO2: 26 mEq/L (ref 19–32)
Calcium: 9.5 mg/dL (ref 8.4–10.5)
Chloride: 105 mEq/L (ref 96–112)
Creatinine, Ser: 0.7 mg/dL (ref 0.40–1.20)
GFR: 122.72 mL/min (ref >60.00)
Glucose, Bld: 73 mg/dL (ref 70–99)
Potassium: 3.9 mEq/L (ref 3.5–5.1)
Sodium: 139 mEq/L (ref 135–145)

## 2015-12-04 LAB — HEPATIC FUNCTION PANEL
ALT: 11 U/L (ref 0–35)
AST: 12 U/L (ref 0–37)
Albumin: 3.9 g/dL (ref 3.5–5.2)
Alkaline Phosphatase: 70 U/L (ref 39–117)
Bilirubin, Direct: 0.1 mg/dL (ref 0.0–0.3)
Total Bilirubin: 0.3 mg/dL (ref 0.2–1.2)
Total Protein: 7.4 g/dL (ref 6.0–8.3)

## 2015-12-04 LAB — LIPID PANEL
Cholesterol: 201 mg/dL — ABNORMAL HIGH (ref 0–200)
HDL: 38.4 mg/dL — ABNORMAL LOW (ref >39.00)
NonHDL: 162.6
Total CHOL/HDL Ratio: 5
Triglycerides: 325 mg/dL — ABNORMAL HIGH (ref 0.0–149.0)
VLDL: 65 mg/dL — ABNORMAL HIGH (ref 0.0–40.0)

## 2015-12-04 LAB — CBC WITH DIFFERENTIAL/PLATELET
Basophils Absolute: 0.1 10*3/uL (ref 0.0–0.1)
Basophils Relative: 0.8 % (ref 0.0–3.0)
Eosinophils Absolute: 0.2 10*3/uL (ref 0.0–0.7)
Eosinophils Relative: 2.3 % (ref 0.0–5.0)
HCT: 35.3 % — ABNORMAL LOW (ref 36.0–46.0)
Hemoglobin: 11.9 g/dL — ABNORMAL LOW (ref 12.0–15.0)
Lymphocytes Relative: 27.8 % (ref 12.0–46.0)
Lymphs Abs: 1.9 10*3/uL (ref 0.7–4.0)
MCHC: 33.7 g/dL (ref 30.0–36.0)
MCV: 89.8 fl (ref 78.0–100.0)
Monocytes Absolute: 0.5 10*3/uL (ref 0.1–1.0)
Monocytes Relative: 7.7 % (ref 3.0–12.0)
Neutro Abs: 4.2 10*3/uL (ref 1.4–7.7)
Neutrophils Relative %: 61.4 % (ref 43.0–77.0)
Platelets: 485 10*3/uL — ABNORMAL HIGH (ref 150.0–400.0)
RBC: 3.93 Mil/uL (ref 3.87–5.11)
RDW: 13.6 % (ref 11.5–15.5)
WBC: 6.8 10*3/uL (ref 4.0–10.5)

## 2015-12-04 LAB — TSH: TSH: 1.1 u[IU]/mL (ref 0.35–4.50)

## 2015-12-04 LAB — LDL CHOLESTEROL, DIRECT: Direct LDL: 108 mg/dL

## 2015-12-04 MED ORDER — NORGESTIMATE-ETH ESTRADIOL 0.25-35 MG-MCG PO TABS: 1.0000 | ORAL_TABLET | Freq: Every day | ORAL | 11 refills | Status: DC

## 2015-12-04 NOTE — Patient Instructions (Signed)
Please complete lab work prior to leaving. Work on [employer], weight loss and exercise when cleared by Engineer, production.  Try to keep your calories to 1500 a day or less. You can keep a log on my Fitness pal.  Goal weight loss in 1-2 pounds per week.

## 2015-12-04 NOTE — Addendum Note (Signed)
Addended by: Kelle Darting A on: 12/04/2015 05:26 PM   Modules accepted: Orders

## 2015-12-04 NOTE — Progress Notes (Signed)
Subjective:    Patient ID: Natalie Kane, female    DOB: [birth year], 35 y.o.   MRN: [id number]  HPI  Patient presents today for complete physical.  Immunizations:  Due for tetanus Diet: healthy Exercise: has not been exercising since her cholecystitis.   Pap: 3 years a go.  Denies abnormal pap.   Wt Readings from Last 3 Encounters:  12/04/15 244 lb 3.2 oz (110.8 kg)  10/16/15 220 lb (99.8 kg)  09/16/15 220 lb (99.8 kg)       Review of Systems  HENT: Negative for rhinorrhea.   Respiratory: Negative for cough.   Cardiovascular: Negative for leg swelling.  Gastrointestinal: Negative for blood in stool, constipation and diarrhea.  Genitourinary: Negative for dysuria, frequency and vaginal discharge.  Musculoskeletal: Negative for arthralgias and joint swelling.  Neurological:       Rare migraines  Hematological: Negative for adenopathy.  Psychiatric/Behavioral:       Denies depression/anxiety   Past Medical History:  Diagnosis Date  . Chicken pox   . Fibroids    microscopic  . Headache(784.0)   . Pregnancy induced hypertension      Social History   Social History  . Marital status: Single    Spouse name: N/A  . Number of children: N/A  . Years of education: N/A   Occupational History  . Not on file.   Social History Main Topics  . Smoking status: Never Smoker  . Smokeless tobacco: Never Used  . Alcohol use No  . Drug use: No  . Sexual activity: Yes   Other Topics Concern  . 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     Past Surgical History:  Procedure Laterality Date  . CESAREAN SECTION  03/13/2011   Procedure: CESAREAN SECTION;  Surgeon: Shon Millet II;  Location: [facility];  Service: Gynecology;  Laterality: N/A;  . CHOLECYSTECTOMY N/A 10/17/2015   Procedure: LAPAROSCOPIC CHOLECYSTECTOMY;  Surgeon: Arta Bruce Kinsinger, MD;   Location: [facility];  Service: General;  Laterality: N/A;    Family History  Problem Relation Age of Onset  . Hypertension Father   . Cancer Father     prostate  . Stroke Maternal Grandmother   . Cancer Paternal Grandmother     colon  . Anesthesia problems Neg Hx   . Hypotension Neg Hx   . Malignant hyperthermia Neg Hx   . Pseudochol deficiency Neg Hx     No Known Allergies  Current Outpatient Prescriptions on File Prior to Visit  Medication Sig Dispense Refill  . norgestimate-ethinyl estradiol (ORTHO-CYCLEN,SPRINTEC,PREVIFEM) 0.25-35 MG-MCG tablet Take 1 tablet by mouth daily. Reported on 05/14/2015     No current facility-administered medications on file prior to visit.     BP 130/70   Pulse 78   Temp 98 F (36.7 C) (Oral)   Resp 18   Ht 6' (1.829 m)   Wt 244 lb 3.2 oz (110.8 kg)   SpO2 98% Comment: room air  BMI 33.12 kg/m       Objective:   Physical Exam  Physical Exam  Constitutional: She is oriented to person, place, and time. She appears well-developed and well-nourished. No distress.  HENT:  Head: Normocephalic and atraumatic.  Right Ear: Tympanic membrane and ear canal normal.  Left Ear: Tympanic membrane and ear canal normal.  Mouth/Throat: Oropharynx is clear and moist.  Eyes:eyes are prominent. ? expophthalmos versus baseline Pupils are equal, round, and reactive to light. No scleral icterus.  Neck: Normal range of motion. No thyromegaly present.  Cardiovascular: Normal rate and regular rhythm.   No murmur heard. Pulmonary/Chest: Effort normal and breath sounds normal. No respiratory distress. He has no wheezes. She has no rales. She exhibits no tenderness.  Abdominal: Soft. Bowel sounds are normal. She exhibits no distension and no mass. There is no tenderness. There is no rebound and no guarding.  Musculoskeletal: She exhibits no edema.  Lymphadenopathy:    She has no cervical adenopathy.  Neurological: She is alert and oriented to person, place,  and time. She has normal patellar reflexes. She exhibits normal muscle tone. Coordination normal.  Skin: Skin is warm and dry.  Psychiatric: She has a normal mood and affect. Her behavior is normal. Judgment and thought content normal.  Breasts: Examined lying Right: Without masses, retractions, discharge or axillary adenopathy.  Left: Without masses, retractions, discharge or axillary adenopathy.  Inguinal/mons: Normal without inguinal adenopathy  External genitalia: Normal  BUS/Urethra/Skene's glands: Normal  Bladder: Normal  Vagina: Normal  Cervix: Normal (bloody discharge noted) Uterus: normal in size, shape and contour. Midline and mobile  Adnexa/parametria:  Rt: Without masses or tenderness.  Lt: Without masses or tenderness.  Anus and perineum: Normal           Assessment & Plan:  Preventative Care- discussed diet, exercise, weight loss.  Obtain routine lab work. Requests GC/Chlamydia, hiv screen       Assessment & Plan:

## 2015-12-04 NOTE — Progress Notes (Signed)
Pre visit review using our clinic review tool, if applicable. No additional management support is needed unless otherwise documented below in the visit note. 

## 2015-12-05 ENCOUNTER — Encounter: Payer: Self-pay | Admitting: Family

## 2015-12-05 ENCOUNTER — Other Ambulatory Visit: Payer: Self-pay | Admitting: Family

## 2015-12-05 DIAGNOSIS — E781 Pure hyperglyceridemia: Secondary | ICD-10-CM

## 2015-12-05 HISTORY — DX: Pure hyperglyceridemia: E78.1

## 2015-12-05 LAB — CYTOLOGY - PAP

## 2015-12-05 LAB — HIV ANTIBODY (ROUTINE TESTING W REFLEX): HIV 1&2 Ab, 4th Generation: NONREACTIVE

## 2015-12-05 NOTE — Progress Notes (Signed)
lipid

## 2015-12-15 ENCOUNTER — Emergency Department (HOSPITAL_BASED_OUTPATIENT_CLINIC_OR_DEPARTMENT_OTHER): Payer: BC Managed Care – PPO

## 2015-12-15 ENCOUNTER — Encounter (HOSPITAL_BASED_OUTPATIENT_CLINIC_OR_DEPARTMENT_OTHER): Payer: Self-pay | Admitting: Emergency Medicine

## 2015-12-15 ENCOUNTER — Emergency Department (HOSPITAL_BASED_OUTPATIENT_CLINIC_OR_DEPARTMENT_OTHER)
Admission: EM | Admit: 2015-12-15 | Discharge: 2015-12-15 | Disposition: A | Discharge status: Home / Self Care | Payer: BC Managed Care – PPO | Attending: Emergency Medicine | Admitting: Emergency Medicine

## 2015-12-15 DIAGNOSIS — R079 Chest pain, unspecified: Secondary | ICD-10-CM

## 2015-12-15 DIAGNOSIS — R0602 Shortness of breath: Secondary | ICD-10-CM | POA: Diagnosis present

## 2015-12-15 DIAGNOSIS — L723 Sebaceous cyst: Secondary | ICD-10-CM | POA: Diagnosis not present

## 2015-12-15 DIAGNOSIS — R0789 Other chest pain: Secondary | ICD-10-CM | POA: Insufficient documentation

## 2015-12-15 LAB — BASIC METABOLIC PANEL
Anion gap: 7 (ref 5–15)
BUN: 8 mg/dL (ref 6–20)
CO2: 26 mmol/L (ref 22–32)
Calcium: 9 mg/dL (ref 8.9–10.3)
Chloride: 104 mmol/L (ref 101–111)
Creatinine, Ser: 0.74 mg/dL (ref 0.44–1.00)
GFR calc Af Amer: >60 mL/min (ref >60)
GFR calc non Af Amer: >60 mL/min (ref >60)
Glucose, Bld: 106 mg/dL — ABNORMAL HIGH (ref 65–99)
Potassium: 3.4 mmol/L — ABNORMAL LOW (ref 3.5–5.1)
Sodium: 137 mmol/L (ref 135–145)

## 2015-12-15 LAB — D-DIMER, QUANTITATIVE: D-Dimer, Quant: 0.44 ug/mL-FEU (ref 0.00–0.50)

## 2015-12-15 LAB — CBC
HCT: 34.4 % — ABNORMAL LOW (ref 36.0–46.0)
Hemoglobin: 11.4 g/dL — ABNORMAL LOW (ref 12.0–15.0)
MCH: 30.6 pg (ref 26.0–34.0)
MCHC: 33.1 g/dL (ref 30.0–36.0)
MCV: 92.2 fL (ref 78.0–100.0)
Platelets: 470 10*3/uL — ABNORMAL HIGH (ref 150–400)
RBC: 3.73 MIL/uL — ABNORMAL LOW (ref 3.87–5.11)
RDW: 12.7 % (ref 11.5–15.5)
WBC: 8.4 10*3/uL (ref 4.0–10.5)

## 2015-12-15 LAB — TROPONIN I: Troponin I: <0.03 ng/mL (ref <0.03)

## 2015-12-15 MED ORDER — LIDOCAINE HCL (PF) 1 % IJ SOLN
5.0000 mL | Freq: Once | INTRAMUSCULAR | Status: AC
Start: 2015-12-15 — End: 2015-12-15
  Administered 2015-12-15: 5 mL
  Filled 2015-12-15: qty 5

## 2015-12-15 MED ORDER — KETOROLAC TROMETHAMINE 60 MG/2ML IM SOLN
60.0000 mg | Freq: Once | INTRAMUSCULAR | Status: AC
  Administered 2015-12-15: 60 mg via INTRAMUSCULAR
  Filled 2015-12-15: qty 2

## 2015-12-15 MED ORDER — NAPROXEN 500 MG PO TABS: 500.0000 mg | ORAL_TABLET | Freq: Two times a day (BID) | ORAL | 0 refills | Status: DC

## 2015-12-15 NOTE — ED Triage Notes (Signed)
Patient states that she has had pain to her chest x 1 month. She reports that she had gallbladder surgery in June because she had chest pain in May and this was supposed to help her. The patient reports that after the surgery that chest pain is still there. Reports that it is worse when she bends down. She also has some SOB. Also since she is here she would like the abscess under her arm looked at

## 2015-12-15 NOTE — Discharge Instructions (Signed)
Take the medication as directed. Rest and drink plenty of fluids.  We aspirated the fluid from the cyst under your arm. Keep the area clean and follow up with your doctor or return here as needed for worsening symptoms.

## 2015-12-15 NOTE — ED Provider Notes (Signed)
[facility] Provider Note   CSN: [id number] Arrival date & time: 12/15/15  1723  By signing my name below, I, Ephriam Jenkins, attest that this documentation has been prepared under the direction and in the presence of [facility] NP.  Electronically Signed: Ephriam Jenkins, ED Scribe. 12/15/15. 7:00 PM.  History   Chief Complaint Chief Complaint  Patient presents with  . Chest Pain  . Recurrent Skin Infections   HPI HPI Comments: Natalie Kane is a 35 y.o. female who presents to the Emergency Department complaining of unchanged chest pain for the past month. At the end of May 2017 the pt started to have same chest pain as well as shortness of breath and was seen by her PCP and prescribed medication for GERD. Pt states she has taken the medication with no relief and has discontinued the medication because she believes it is not related and was not helping. Pt then was seen on June 20th for [place] of "clear organs" and was dx with Gallstones. Pt was sent to [facility] and had a cholecystectomy. Pt states she has still had intermittent chest pain s/p surgery. Pt states the pain is exacerbated when bending down and while taking deep breaths. Pt also states that she is still having some shortness of breath. Pt describes the chest pain as "soreness". Pt has taken ibuprofen, aleve, tylenol with no relief; last dose of Tylenol this afternoon PTA. Pt also complains of mild nausea. Pt also complains of a "boil" under her right arm that she requests to be evaluated.   The history is provided by the patient. No language interpreter was used.   Past Medical History:  Diagnosis Date  . Chicken pox   . Fibroids    microscopic  . Headache(784.0)   . Hypertriglyceridemia 12/05/2015  . Pregnancy induced hypertension     Patient Active Problem List   Diagnosis Date Noted  . Hypertriglyceridemia 12/05/2015  . Biliary colic 123456  . Cholelithiasis with cholecystitis without obstruction 10/16/2015  .  Hidradenitis suppurativa of left axilla 05/14/2015  . Superficial laceration 05/14/2015  . Subconjunctival hemorrhage of right eye 05/14/2015  . PIH (pregnancy induced hypertension) 03/13/2011    Past Surgical History:  Procedure Laterality Date  . CESAREAN SECTION  03/13/2011   Procedure: CESAREAN SECTION;  Surgeon: Shon Millet II;  Location: [facility];  Service: Gynecology;  Laterality: N/A;  . CHOLECYSTECTOMY N/A 10/17/2015   Procedure: LAPAROSCOPIC CHOLECYSTECTOMY;  Surgeon: Arta Bruce Kinsinger, MD;  Location: [facility];  Service: General;  Laterality: N/A;    OB History    Gravida Para Term Preterm AB Living   1 1 1     1    SAB TAB Ectopic Multiple Live Births           1       Home Medications    Prior to Admission medications   Medication Sig Start Date End Date Taking? Authorizing Provider  norgestimate-ethinyl estradiol (ORTHO-CYCLEN,SPRINTEC,PREVIFEM) 0.25-35 MG-MCG tablet Take 1 tablet by mouth daily. Reported on 05/14/2015 12/04/15   Debbrah Alar, NP    Family History Family History  Problem Relation Age of Onset  . Hypertension Father   . Cancer Father     prostate  . Stroke Maternal Grandmother   . Cancer Paternal Grandmother     colon  . Anesthesia problems Neg Hx   . Hypotension Neg Hx   . Malignant hyperthermia Neg Hx   . Pseudochol deficiency Neg Hx  Social History Social History  Substance Use Topics  . Smoking status: Never Smoker  . Smokeless tobacco: Never Used  . Alcohol use No     Allergies   Review of patient's allergies indicates no known allergies.   Review of Systems Review of Systems  Respiratory: Positive for shortness of breath (mild).   Cardiovascular: Positive for chest pain ("soreness").  Gastrointestinal: Positive for nausea.  Neurological: Negative for syncope.  All other systems reviewed and are negative.    Physical Exam Updated Vital Signs BP 117/63 (BP Location: Left Arm)   Pulse 88   Temp 99.1 F  (37.3 C) (Oral)   Resp 18   Ht 6' (1.829 m)   Wt 108.9 kg   LMP 11/27/2015   SpO2 100%   BMI 32.55 kg/m   Physical Exam  Constitutional: She is oriented to person, place, and time. She appears well-developed and well-nourished. No distress.  HENT:  Head: Normocephalic and atraumatic.  Eyes: EOM are normal.  Neck: Normal range of motion. Neck supple.  Cardiovascular: Normal rate and regular rhythm.   Pulmonary/Chest: Effort normal and breath sounds normal. No respiratory distress. She exhibits tenderness.  Chest wall tender with palpation  Abdominal: Soft. Bowel sounds are normal. There is no tenderness.  Musculoskeletal: Normal range of motion.  Neurological: She is alert and oriented to person, place, and time.  Skin: Skin is warm and dry. She is not diaphoretic.  Raised fluctuant area approximately 1 cm right axilla.    Psychiatric: She has a normal mood and affect. Her behavior is normal.  Nursing note and vitals reviewed.    ED Treatments / Results  DIAGNOSTIC STUDIES: Oxygen Saturation is 100% on RA, normal by my interpretation.  COORDINATION OF CARE: 6:49 PM-Will order blood work and wait for imaging results. Discussed treatment plan with pt at bedside and pt agreed to plan.   Labs (all labs ordered are listed, but only abnormal results are displayed) Labs Reviewed  CBC - Abnormal; Notable for the following:       Result Value   RBC 3.73 (*)    Hemoglobin 11.4 (*)    HCT 34.4 (*)    Platelets 470 (*)    All other components within normal limits  BASIC METABOLIC PANEL - Abnormal; Notable for the following:    Potassium 3.4 (*)    Glucose, Bld 106 (*)    All other components within normal limits  TROPONIN I  D-DIMER, QUANTITATIVE (NOT AT [facility])    EKG  EKG Interpretation  Date/Time:  Saturday December 15 2015 18:06:30 EDT Ventricular Rate:  91 PR Interval:    QRS Duration: 102 QT Interval:  369 QTC Calculation: [id number] R Axis:   44 Text Interpretation:   Sinus rhythm Probable left atrial enlargement RSR' in V1 or V2, right VCD or RVH No significant change since last tracing Confirmed by BEATON  MD, ROBERT [phone]) on 12/15/2015 6:12:21 PM       Radiology Dg Chest 2 View  Result Date: 12/15/2015 CLINICAL DATA:  Intermittent chest pain, shortness of breath for 2 months EXAM: CHEST  2 VIEW COMPARISON:  09/17/2015 FINDINGS: Cardiomediastinal silhouette is stable. No acute infiltrate or pleural effusion. No pulmonary edema. Bony thorax is unremarkable. IMPRESSION: No active cardiopulmonary disease. Electronically Signed   By: Lahoma Crocker M.D.   On: 12/15/2015 18:50    Procedures .Marland KitchenIncision and Drainage Date/Time: 12/15/2015 9:34 PM Performed by: Ashley Murrain Authorized by: Ashley Murrain   Consent:  Consent obtained:  Verbal   Consent given by:  Patient   Risks discussed:  Infection   Alternatives discussed:  No treatment Location:    Type:  Cyst   Location:  Upper extremity   Upper extremity location:  Arm   Arm location:  R upper arm (right axilla) Pre-procedure details:    Skin preparation:  Betadine Anesthesia (see MAR for exact dosages):    Anesthesia method:  Local infiltration   Local anesthetic:  Lidocaine 1% w/o epi Procedure type:    Complexity:  Simple Procedure details:    Needle aspiration: yes     Needle size:  18 G   Incision depth:  Subcutaneous   Drainage:  Serosanguinous   Drainage amount: 1cc.   Wound treatment:  Wound left open Post-procedure details:    Patient tolerance of procedure:  Tolerated well, no immediate complications   (including critical care time)  Medications Ordered in ED Medications  lidocaine (PF) (XYLOCAINE) 1 % injection 5 mL (5 mLs Infiltration Given by Other 12/15/15 2056)  ketorolac (TORADOL) injection 60 mg (60 mg Intramuscular Given 12/15/15 2053)     Initial Impression / Assessment and Plan / ED Course  I have reviewed the triage vital signs and the nursing notes.  Pertinent  labs & imaging results that were available during my care of the patient were reviewed by me and considered in my medical decision making (see chart for details).  Clinical Course   Patient's pain improved after Toradol.   Final Clinical Impressions(s) / ED Diagnoses  35 y.o. female with chest pain that has been off and on for the past month that increases with movement, deep breath and cough stable for d/c without abnormal cardiac findings and normal cxr. Discussed with the patient lab, x-ray and EKG findings and plan of care. All questioned fully answered. She will f/u with her PCP or return if any problems arise.  Final diagnoses:  Chest wall pain  Nonspecific chest pain  Sebaceous cyst of left axilla    New Prescriptions New Prescriptions   No medications on file   I personally performed the services described in this documentation, which was scribed in my presence. The recorded information has been reviewed and is accurate.     [address] C[place], NP 12/15/15 [place], MD 12/16/15 [phone]

## 2016-01-25 ENCOUNTER — Ambulatory Visit (INDEPENDENT_AMBULATORY_CARE_PROVIDER_SITE_OTHER): Payer: BC Managed Care – PPO | Admitting: Physician Assistant

## 2016-01-25 ENCOUNTER — Encounter: Payer: Self-pay | Admitting: *Deleted

## 2016-01-25 ENCOUNTER — Encounter: Payer: Self-pay | Admitting: Physician Assistant

## 2016-01-25 VITALS — BP 110/78 | HR 75 | Temp 98.1°F | Resp 16 | Ht 72.0 in | Wt 247.4 lb

## 2016-01-25 DIAGNOSIS — G43909 Migraine, unspecified, not intractable, without status migrainosus: Secondary | ICD-10-CM

## 2016-01-25 DIAGNOSIS — J329 Chronic sinusitis, unspecified: Secondary | ICD-10-CM | POA: Diagnosis not present

## 2016-01-25 DIAGNOSIS — B9789 Other viral agents as the cause of diseases classified elsewhere: Secondary | ICD-10-CM

## 2016-01-25 DIAGNOSIS — B349 Viral infection, unspecified: Secondary | ICD-10-CM

## 2016-01-25 MED ORDER — METHYLPREDNISOLONE ACETATE 40 MG/ML IJ SUSP
40.0000 mg | Freq: Once | INTRAMUSCULAR | Status: AC
  Administered 2016-01-25: 40 mg via INTRAMUSCULAR

## 2016-01-25 MED ORDER — ZOLMITRIPTAN 2.5 MG PO TBDP: 2.5000 mg | ORAL_TABLET | ORAL | 0 refills | Status: DC | PRN

## 2016-01-25 MED ORDER — FLUTICASONE PROPIONATE 50 MCG/ACT NA SUSP: 2.0000 | Freq: Every day | NASAL | 6 refills | Status: DC

## 2016-01-25 NOTE — Progress Notes (Signed)
Patient presents to clinic today c/o 2 days of R sided maxillary sinus pressure with some PND and scratchy throat. Denies chest congestion or cough at present. Noes ear pressure but denies ear pain or sinus pain. Endorses flare up of migraine over the past day with intermittent aura and photophobia. Denies vomiting. Denies AMS or vision changes. Denies recent travel or sick contact.   Past Medical History:  Diagnosis Date  . Chicken pox   . Fibroids    microscopic  . Headache(784.0)   . Hypertriglyceridemia 12/05/2015  . Pregnancy induced hypertension     Current Outpatient Prescriptions on File Prior to Visit  Medication Sig Dispense Refill  . naproxen (NAPROSYN) 500 MG tablet Take 1 tablet (500 mg total) by mouth 2 (two) times daily. (Patient taking differently: Take 500 mg by mouth 2 (two) times daily as needed. ) 20 tablet 0  . norgestimate-ethinyl estradiol (ORTHO-CYCLEN,SPRINTEC,PREVIFEM) 0.25-35 MG-MCG tablet Take 1 tablet by mouth daily. Reported on 05/14/2015 1 Package 11   No current facility-administered medications on file prior to visit.     No Known Allergies  Family History  Problem Relation Age of Onset  . Hypertension Father   . Cancer Father     prostate  . Stroke Maternal Grandmother   . Cancer Paternal Grandmother     colon  . Anesthesia problems Neg Hx   . Hypotension Neg Hx   . Malignant hyperthermia Neg Hx   . Pseudochol deficiency Neg Hx     Social History   Social History  . Marital status: Single    Spouse name: N/A  . Number of children: N/A  . Years of education: N/A   Social History Main Topics  . Smoking status: Never Smoker  . Smokeless tobacco: Never Used  . Alcohol use No  . Drug use: No  . Sexual activity: Yes   Other Topics Concern  . 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    Review of Systems - See HPI.  All other ROS  are negative.  BP 110/78 (BP Location: Left Arm, Patient Position: Sitting, Cuff Size: Large)   Pulse 75   Temp 98.1 F (36.7 C) (Oral)   Resp 16   Ht 6' (1.829 m)   Wt 247 lb 6 oz (112.2 kg)   LMP 01/23/2016   SpO2 98%   BMI 33.55 kg/m   Physical Exam  Constitutional: She is oriented to person, place, and time and well-developed, well-nourished, and in no distress.  HENT:  Head: Normocephalic and atraumatic.  Right Ear: Tympanic membrane and external ear normal.  Left Ear: Tympanic membrane and external ear normal.  Nose: Mucosal edema and rhinorrhea present.  Mouth/Throat: Uvula is midline, oropharynx is clear and moist and mucous membranes are normal.  Eyes: Conjunctivae are normal. Pupils are equal, round, and reactive to light.  Neck: Normal range of motion. Neck supple.  Cardiovascular: Normal rate, regular rhythm, normal heart sounds and intact distal pulses.   Pulmonary/Chest: Effort normal and breath sounds normal. No respiratory distress. She has no wheezes. She has no rales. She exhibits no tenderness.  Lymphadenopathy:    She has no cervical adenopathy.  Neurological: She is alert and oriented to person, place, and time.  Skin: Skin is warm and dry. No rash noted.  Psychiatric: Affect normal.  Vitals reviewed.   Recent Results (from the  past 2160 hour(s))  Cytology -Pap Smear     Status: None   Collection Time: 12/04/15 12:00 AM  Result Value Ref Range   CYTOLOGY - PAP PAP RESULT   Basic metabolic panel     Status: None   Collection Time: 12/04/15 12:24 PM  Result Value Ref Range   Sodium 139 135 - 145 mEq/L   Potassium 3.9 3.5 - 5.1 mEq/L   Chloride 105 96 - 112 mEq/L   CO2 26 19 - 32 mEq/L   Glucose, Bld 73 70 - 99 mg/dL   BUN 9 6 - 23 mg/dL   Creatinine, Ser 0.70 0.40 - 1.20 mg/dL   Calcium 9.5 8.4 - 10.5 mg/dL   GFR 122.72 >60.00 mL/min  CBC with Differential/Platelet     Status: Abnormal   Collection Time: 12/04/15 12:24 PM  Result Value Ref Range    WBC 6.8 4.0 - 10.5 K/uL   RBC 3.93 3.87 - 5.11 Mil/uL   Hemoglobin 11.9 (L) 12.0 - 15.0 g/dL   HCT 35.3 (L) 36.0 - 46.0 %   MCV 89.8 78.0 - 100.0 fl   MCHC 33.7 30.0 - 36.0 g/dL   RDW 13.6 11.5 - 15.5 %   Platelets 485.0 (H) 150.0 - 400.0 K/uL   Neutrophils Relative % 61.4 43.0 - 77.0 %   Lymphocytes Relative 27.8 12.0 - 46.0 %   Monocytes Relative 7.7 3.0 - 12.0 %   Eosinophils Relative 2.3 0.0 - 5.0 %   Basophils Relative 0.8 0.0 - 3.0 %   Neutro Abs 4.2 1.4 - 7.7 K/uL   Lymphs Abs 1.9 0.7 - 4.0 K/uL   Monocytes Absolute 0.5 0.1 - 1.0 K/uL   Eosinophils Absolute 0.2 0.0 - 0.7 K/uL   Basophils Absolute 0.1 0.0 - 0.1 K/uL  Hepatic function panel     Status: None   Collection Time: 12/04/15 12:24 PM  Result Value Ref Range   Total Bilirubin 0.3 0.2 - 1.2 mg/dL   Bilirubin, Direct 0.1 0.0 - 0.3 mg/dL   Alkaline Phosphatase 70 39 - 117 U/L   AST 12 0 - 37 U/L   ALT 11 0 - 35 U/L   Total Protein 7.4 6.0 - 8.3 g/dL   Albumin 3.9 3.5 - 5.2 g/dL  Lipid panel     Status: Abnormal   Collection Time: 12/04/15 12:24 PM  Result Value Ref Range   Cholesterol 201 (H) 0 - 200 mg/dL    Comment: ATP III Classification       Desirable:  < 200 mg/dL               Borderline High:  200 - 239 mg/dL          High:  > = 240 mg/dL   Triglycerides 325.0 (H) 0.0 - 149.0 mg/dL    Comment: Normal:  <150 mg/dLBorderline High:  150 - 199 mg/dL   HDL 38.40 (L) >39.00 mg/dL   VLDL 65.0 (H) 0.0 - 40.0 mg/dL   Total CHOL/HDL Ratio 5     Comment:                Men          Women1/2 Average Risk     3.4          3.3Average Risk          5.0          4.42X Average Risk          9.6  7.13X Average Risk          15.0          11.0                       NonHDL 162.60     Comment: NOTE:  Non-HDL goal should be 30 mg/dL higher than patient's LDL goal (i.e. LDL goal of < 70 mg/dL, would have non-HDL goal of < 100 mg/dL)  TSH     Status: None   Collection Time: 12/04/15 12:24 PM  Result Value Ref Range    TSH 1.10 0.35 - 4.50 uIU/mL  Urinalysis, Routine w reflex microscopic (not at [facility])     Status: None   Collection Time: 12/04/15 12:24 PM  Result Value Ref Range   Color, Urine YELLOW Yellow;Lt. Yellow   APPearance CLEAR Clear   Specific Gravity, Urine 1.020 1.000 - 1.030   pH 6.0 5.0 - 8.0   Total Protein, Urine NEGATIVE Negative   Urine Glucose NEGATIVE Negative   Ketones, ur NEGATIVE Negative   Bilirubin Urine NEGATIVE Negative   Hgb urine dipstick NEGATIVE Negative   Urobilinogen, UA 0.2 0.0 - 1.0   Leukocytes, UA NEGATIVE Negative   Nitrite NEGATIVE Negative   WBC, UA 0-2/hpf 0-2/hpf   RBC / HPF 0-2/hpf 0-2/hpf   Squamous Epithelial / LPF Rare(0-4/hpf) Rare(0-4/hpf)  HIV antibody (with reflex)     Status: None   Collection Time: 12/04/15 12:24 PM  Result Value Ref Range   HIV 1&2 Ab, 4th Generation NONREACTIVE NONREACTIVE    Comment:   HIV-1 antigen and HIV-1/HIV-2 antibodies were not detected.  There is no laboratory evidence of HIV infection.   HIV-1/2 Antibody Diff        Not indicated. HIV-1 RNA, Qual TMA          Not indicated.     PLEASE NOTE: This information has been disclosed to you from records whose confidentiality may be protected by state law. If your state requires such protection, then the state law prohibits you from making any further disclosure of the information without the specific written consent of the person to whom it pertains, or as otherwise permitted by law. A general authorization for the release of medical or other information is NOT sufficient for this purpose.   The performance of this assay has not been clinically validated in patients less than 23 years old.   For additional information please refer to http://education.quest[employer].com/faq/FAQ106.  (This link is being provided for informational/educational purposes only.)     LDL cholesterol, direct     Status: None   Collection Time: 12/04/15 12:24 PM  Result Value Ref Range     Direct LDL 108.0 mg/dL    Comment: Optimal:  <100 mg/dLNear or Above Optimal:  100-129 mg/dLBorderline High:  130-159 mg/dLHigh:  160-189 mg/dLVery High:  >190 mg/dL  CBC     Status: Abnormal   Collection Time: 12/15/15  6:15 PM  Result Value Ref Range   WBC 8.4 4.0 - 10.5 K/uL   RBC 3.73 (L) 3.87 - 5.11 MIL/uL   Hemoglobin 11.4 (L) 12.0 - 15.0 g/dL   HCT 34.4 (L) 36.0 - 46.0 %   MCV 92.2 78.0 - 100.0 fL   MCH 30.6 26.0 - 34.0 pg   MCHC 33.1 30.0 - 36.0 g/dL   RDW 12.7 11.5 - 15.5 %   Platelets 470 (H) 150 - 400 K/uL  Basic metabolic panel     Status:  Abnormal   Collection Time: 12/15/15  6:15 PM  Result Value Ref Range   Sodium 137 135 - 145 mmol/L   Potassium 3.4 (L) 3.5 - 5.1 mmol/L   Chloride 104 101 - 111 mmol/L   CO2 26 22 - 32 mmol/L   Glucose, Bld 106 (H) 65 - 99 mg/dL   BUN 8 6 - 20 mg/dL   Creatinine, Ser 0.74 0.44 - 1.00 mg/dL   Calcium 9.0 8.9 - 10.3 mg/dL   GFR calc non Af Amer >60 >60 mL/min   GFR calc Af Amer >60 >60 mL/min    Comment: (NOTE) The eGFR has been calculated using the CKD EPI equation. This calculation has not been validated in all clinical situations. eGFR's persistently <60 mL/min signify possible Chronic Kidney Disease.    Anion gap 7 5 - 15  Troponin I     Status: None   Collection Time: 12/15/15  6:15 PM  Result Value Ref Range   Troponin I <0.03 <0.03 ng/mL  D-dimer, quantitative (not at [facility])     Status: None   Collection Time: 12/15/15  6:15 PM  Result Value Ref Range   D-Dimer, Quant 0.44 0.00 - 0.50 ug/mL-FEU    Comment: (NOTE) At the manufacturer cut-off of 0.50 ug/mL FEU, this assay has been documented to exclude PE with a sensitivity and negative predictive value of 97 to 99%.  At this time, this assay has not been approved by the FDA to exclude DVT/VTE. Results should be correlated with clinical presentation.     Assessment/Plan: 1. Migraine without status migrainosus, not intractable, unspecified migraine type Rx  Zomig. Suspect viral sinusitis is exacerbating migraine. Supportive measures and OTC medications reviewed. Follow-up if not resolving. - zolmitriptan (ZOMIG-ZMT) 2.5 MG disintegrating tablet; Take 1 tablet (2.5 mg total) by mouth as needed for migraine. You may repeat once in 2 hours after 1st dose  Dispense: 10 tablet; Refill: 0  2. Viral sinusitis Im Depo medrol given for sinus inflammation. Will begin Flonase and antihistamine with saline nasal spray. Supportive measures reviewed. No indication for ABX at present. FU if not improving.  - fluticasone (FLONASE) 50 MCG/ACT nasal spray; Place 2 sprays into both nostrils daily.  Dispense: 16 g; Refill: 6 - methylPREDNISolone acetate (DEPO-MEDROL) injection 40 mg; Inject 1 mL (40 mg total) into the muscle once.   Leeanne Rio, PA-C

## 2016-01-25 NOTE — Progress Notes (Signed)
Pre visit review using our clinic review tool, if applicable. No additional management support is needed unless otherwise documented below in the visit note/SLS  

## 2016-01-25 NOTE — Patient Instructions (Signed)
Increase fluid intake.  Use Saline nasal spray.  Take a daily multivitamin. Use the Flonase as directed.  Start the Zomig to abort the migraine headache. The steroid shot given today should help with sinus inflammation  Place a humidifier in the bedroom.  Please call or return clinic if symptoms are not improving.  Sinusitis Sinusitis is redness, soreness, and swelling (inflammation) of the paranasal sinuses. Paranasal sinuses are air pockets within the bones of your face (beneath the eyes, the middle of the forehead, or above the eyes). In healthy paranasal sinuses, mucus is able to drain out, and air is able to circulate through them by way of your nose. However, when your paranasal sinuses are inflamed, mucus and air can become trapped. This can allow bacteria and other germs to grow and cause infection. Sinusitis can develop quickly and last only a short time (acute) or continue over a long period (chronic). Sinusitis that lasts for more than 12 weeks is considered chronic.  CAUSES  Causes of sinusitis include:  Allergies.  Structural abnormalities, such as displacement of the cartilage that separates your nostrils (deviated septum), which can decrease the air flow through your nose and sinuses and affect sinus drainage.  Functional abnormalities, such as when the small hairs (cilia) that line your sinuses and help remove mucus do not work properly or are not present. SYMPTOMS  Symptoms of acute and chronic sinusitis are the same. The primary symptoms are pain and pressure around the affected sinuses. Other symptoms include:  Upper toothache.  Earache.  Headache.  Bad breath.  Decreased sense of smell and taste.  A cough, which worsens when you are lying flat.  Fatigue.  Fever.  Thick drainage from your nose, which often is green and may contain pus (purulent).  Swelling and warmth over the affected sinuses. DIAGNOSIS  Your caregiver will perform a physical exam. During the  exam, your caregiver may:  Look in your nose for signs of abnormal growths in your nostrils (nasal polyps).  Tap over the affected sinus to check for signs of infection.  View the inside of your sinuses (endoscopy) with a special imaging device with a light attached (endoscope), which is inserted into your sinuses. If your caregiver suspects that you have chronic sinusitis, one or more of the following tests may be recommended:  Allergy tests.  Nasal culture A sample of mucus is taken from your nose and sent to a lab and screened for bacteria.  Nasal cytology A sample of mucus is taken from your nose and examined by your caregiver to determine if your sinusitis is related to an allergy. TREATMENT  Most cases of acute sinusitis are related to a viral infection and will resolve on their own within 10 days. Sometimes medicines are prescribed to help relieve symptoms (pain medicine, decongestants, nasal steroid sprays, or saline sprays).  However, for sinusitis related to a bacterial infection, your caregiver will prescribe antibiotic medicines. These are medicines that will help kill the bacteria causing the infection.  Rarely, sinusitis is caused by a fungal infection. In theses cases, your caregiver will prescribe antifungal medicine. For some cases of chronic sinusitis, surgery is needed. Generally, these are cases in which sinusitis recurs more than 3 times per year, despite other treatments. HOME CARE INSTRUCTIONS   Drink plenty of water. Water helps thin the mucus so your sinuses can drain more easily.  Use a humidifier.  Inhale steam 3 to 4 times a day (for example, sit in the bathroom with  the shower running).  Apply a warm, moist washcloth to your face 3 to 4 times a day, or as directed by your caregiver.  Use saline nasal sprays to help moisten and clean your sinuses.  Take over-the-counter or prescription medicines for pain, discomfort, or fever only as directed by your  caregiver. SEEK IMMEDIATE MEDICAL CARE IF:  You have increasing pain or severe headaches.  You have nausea, vomiting, or drowsiness.  You have swelling around your face.  You have vision problems.  You have a stiff neck.  You have difficulty breathing. MAKE SURE YOU:   Understand these instructions.  Will watch your condition.  Will get help right away if you are not doing well or get worse. Document Released: 04/14/2005 Document Revised: 07/07/2011 Document Reviewed: 04/29/2011 [facility] Patient Information 2014 [place], [place].

## 2016-01-30 ENCOUNTER — Encounter: Payer: Self-pay | Admitting: Medical

## 2016-01-30 ENCOUNTER — Ambulatory Visit (INDEPENDENT_AMBULATORY_CARE_PROVIDER_SITE_OTHER): Payer: BC Managed Care – PPO | Admitting: Medical

## 2016-01-30 ENCOUNTER — Ambulatory Visit (HOSPITAL_BASED_OUTPATIENT_CLINIC_OR_DEPARTMENT_OTHER)
Admission: RE | Admit: 2016-01-30 | Discharge: 2016-01-30 | Disposition: A | Discharge status: Home / Self Care | Payer: BC Managed Care – PPO | Source: Ambulatory Visit | Attending: Medical | Admitting: Medical

## 2016-01-30 VITALS — BP 139/77 | HR 95 | Temp 98.9°F | Ht 72.0 in | Wt 251.6 lb

## 2016-01-30 DIAGNOSIS — R062 Wheezing: Secondary | ICD-10-CM

## 2016-01-30 DIAGNOSIS — J209 Acute bronchitis, unspecified: Secondary | ICD-10-CM | POA: Diagnosis present

## 2016-01-30 DIAGNOSIS — R509 Fever, unspecified: Secondary | ICD-10-CM | POA: Diagnosis not present

## 2016-01-30 LAB — POC INFLUENZA A&B (BINAX/QUICKVUE)
Influenza A, POC: NEGATIVE
Influenza B, POC: NEGATIVE

## 2016-01-30 MED ORDER — FLUCONAZOLE 150 MG PO TABS: 150.0000 mg | ORAL_TABLET | Freq: Once | ORAL | 0 refills | Status: AC

## 2016-01-30 MED ORDER — FLUCONAZOLE 150 MG PO TABS: 150.0000 mg | ORAL_TABLET | Freq: Once | ORAL | 0 refills | Status: DC

## 2016-01-30 MED ORDER — FLUTICASONE PROPIONATE HFA 110 MCG/ACT IN AERO
2.0000 | INHALATION_SPRAY | Freq: Two times a day (BID) | RESPIRATORY_TRACT | 2 refills | Status: DC
Start: 2016-01-30 — End: 2016-02-05

## 2016-01-30 MED ORDER — ALBUTEROL SULFATE HFA 108 (90 BASE) MCG/ACT IN AERS: 2.0000 | INHALATION_SPRAY | Freq: Four times a day (QID) | RESPIRATORY_TRACT | 0 refills | Status: DC | PRN

## 2016-01-30 MED ORDER — AZITHROMYCIN 250 MG PO TABS: ORAL_TABLET | ORAL | 0 refills | Status: DC

## 2016-01-30 MED ORDER — HYDROCODONE-HOMATROPINE 5-1.5 MG/5ML PO SYRP: 5.0000 mL | ORAL_SOLUTION | Freq: Three times a day (TID) | ORAL | 0 refills | Status: DC | PRN

## 2016-01-30 NOTE — Patient Instructions (Addendum)
Your appear to have bronchitis vs possible pneumonia. I want to get cxr stat/today to see result before choosing antibiotic. No pneumonia on cxr so azithromycin sent to pharmacy. Pt notified.   For cough rx hycodan. Rx advisement.   Work note return on Monday.  For wheezing rx flovent and albuterol  If you find yourself wheezing despite the above measures then will rx tapered prednisone  Follow up in 7 days or as needed

## 2016-01-30 NOTE — Progress Notes (Signed)
Pre visit review using our clinic tool,if applicable. No additional management support is needed unless otherwise documented below in the visit note.  

## 2016-01-30 NOTE — Progress Notes (Signed)
   Subjective:    Patient ID: Natalie Kane, female    DOB: 1980/12/24, 35 y.o.   MRN: [id number]  HPI  Pt in reporting sick for about 4-5 days.   Pt states started with runny nose then got cough. Pt states cough is daily and almost constant.  Cough dry at times but also prodcuctive about 1/2 the time.  Some fever subjective yesterday.  No hx of asthma. No hx of wheezing. She is not a smoker.   Some wheezing intermittent mild. Cough keeping her up at night.  Pt has some throat pain. Worse when coughing and started after a couple of days of severe cough.  LMP- last wed.normal and came when she expected it to.          Review of Systems  Constitutional: Positive for fatigue and fever. Negative for chills.  HENT: Negative for congestion, mouth sores, rhinorrhea, sinus pressure, sneezing and sore throat.   Respiratory: Positive for cough and wheezing. Negative for chest tightness and shortness of breath.   Cardiovascular: Negative for chest pain and palpitations.  Gastrointestinal: Negative for abdominal pain.  Skin: Negative for rash.  Neurological: Negative for dizziness and headaches.  Hematological: Negative for adenopathy. Does not bruise/bleed easily.  Psychiatric/Behavioral: Negative for agitation and confusion.       Objective:   Physical Exam  General  Mental Status - Alert. General Appearance - Well groomed. Not in acute distress.  Skin Rashes- No Rashes.  HEENT Head- Normal. Ear Auditory Canal - Left- Normal. Right - Normal.Tympanic Membrane- Left- Normal. Right- Normal. Eye Sclera/Conjunctiva- Left- Normal. Right- Normal. Nose & Sinuses Nasal Mucosa- Left-  Boggy and Congested. Right-  Boggy and  Congested.Bilateral faint  maxillary and frontal sinus pressure. Mouth & Throat Lips: Upper Lip- Normal: no dryness, cracking, pallor, cyanosis, or vesicular eruption. Lower Lip-Normal: no dryness, cracking, pallor, cyanosis or vesicular eruption. Buccal  Mucosa- Bilateral- No Aphthous ulcers. Oropharynx- No Discharge or Erythema. Tonsils: Characteristics- Bilateral- No Erythema or Congestion. Size/Enlargement- Bilateral- No enlargement. Discharge- bilateral-None.  Neck Neck- Supple. No Masses.   Chest and Lung Exam Auscultation: Breath Sounds:- even and unlabored but coarse breath sounds both sides. Some expiratory wheeze.  Cardiovascular Auscultation:Rythm- Regular, rate and rhythm. Murmurs & Other Heart Sounds:Ausculatation of the heart reveal- No Murmurs.  Lymphatic Head & Neck General Head & Neck Lymphatics: Bilateral: Description- No Localized lymphadenopathy.       Assessment & Plan:  Your appear to have bronchitis vs possible pneumonia. I want to get cxr stat/today to see result before choosing antibiotic. I did call pt and go over cxr. No pneumonia so azithromycin sent to her pharmacy.   For cough rx hycodan. Rx advisement.   Work note return on Monday.  For wheezing rx flovent and albuterol  If you find yourself wheezing despite the above measures then will rx tapered prednisone  Follow up in 7 days or as needed  Attempts to get cbc failed by phlebotomist. Pt appears dehydrate per Shanon Brow. So decided not to get cbc. Advised pt to hydrate.  Elmira Olkowski, Percell Miller, PA-C

## 2016-02-01 ENCOUNTER — Encounter: Payer: Self-pay | Admitting: Medical

## 2016-02-01 MED ORDER — ONDANSETRON 8 MG PO TBDP: 8.0000 mg | ORAL_TABLET | Freq: Three times a day (TID) | ORAL | 0 refills | Status: DC | PRN

## 2016-02-01 NOTE — Telephone Encounter (Signed)
I did talk with pt. She has felt nausea with her other symptoms and vomited one time. But no abdomen pain. I advised will send in zofran rx to her phamacy. If she get wheezing or sob despite current treatment then start taper prednsone I made available. Make sure to eat some food before taking meds(not on empty stomach). If not better by Sunday night then try to schedule with me early Monday am. She could try to schedule on line or call very early on Monday call our office. If worsens over weekend then ED option as well.

## 2016-02-05 ENCOUNTER — Encounter: Payer: Self-pay | Admitting: Family

## 2016-02-05 ENCOUNTER — Encounter: Payer: Self-pay | Admitting: Medical

## 2016-02-05 ENCOUNTER — Ambulatory Visit (HOSPITAL_BASED_OUTPATIENT_CLINIC_OR_DEPARTMENT_OTHER)
Admission: RE | Admit: 2016-02-05 | Discharge: 2016-02-05 | Disposition: A | Discharge status: Home / Self Care | Payer: BC Managed Care – PPO | Source: Ambulatory Visit | Attending: Family | Admitting: Family

## 2016-02-05 ENCOUNTER — Ambulatory Visit (INDEPENDENT_AMBULATORY_CARE_PROVIDER_SITE_OTHER): Payer: BC Managed Care – PPO | Admitting: Family

## 2016-02-05 ENCOUNTER — Telehealth: Payer: Self-pay | Admitting: Family

## 2016-02-05 VITALS — BP 128/79 | HR 79 | Temp 98.9°F | Resp 18 | Ht 72.0 in | Wt 251.8 lb

## 2016-02-05 DIAGNOSIS — J181 Lobar pneumonia, unspecified organism: Secondary | ICD-10-CM | POA: Diagnosis not present

## 2016-02-05 DIAGNOSIS — J209 Acute bronchitis, unspecified: Secondary | ICD-10-CM

## 2016-02-05 DIAGNOSIS — R918 Other nonspecific abnormal finding of lung field: Secondary | ICD-10-CM | POA: Diagnosis not present

## 2016-02-05 DIAGNOSIS — R05 Cough: Secondary | ICD-10-CM | POA: Diagnosis not present

## 2016-02-05 DIAGNOSIS — R059 Cough, unspecified: Secondary | ICD-10-CM

## 2016-02-05 DIAGNOSIS — J189 Pneumonia, unspecified organism: Secondary | ICD-10-CM

## 2016-02-05 MED ORDER — MOXIFLOXACIN HCL 400 MG PO TABS: 400.0000 mg | ORAL_TABLET | Freq: Every day | ORAL | 0 refills | Status: DC

## 2016-02-05 MED ORDER — BENZONATATE 100 MG PO CAPS: 100.0000 mg | ORAL_CAPSULE | Freq: Three times a day (TID) | ORAL | 0 refills | Status: DC | PRN

## 2016-02-05 MED ORDER — ALBUTEROL SULFATE (2.5 MG/3ML) 0.083% IN NEBU: 2.5000 mg | INHALATION_SOLUTION | Freq: Four times a day (QID) | RESPIRATORY_TRACT | 3 refills | Status: DC | PRN

## 2016-02-05 MED ORDER — ALBUTEROL SULFATE (2.5 MG/3ML) 0.083% IN NEBU
2.5000 mg | INHALATION_SOLUTION | Freq: Once | RESPIRATORY_TRACT | Status: AC
  Administered 2016-02-05: 2.5 mg via RESPIRATORY_TRACT

## 2016-02-05 MED ORDER — PREDNISONE 10 MG PO TABS: ORAL_TABLET | ORAL | 0 refills | Status: DC

## 2016-02-05 NOTE — Telephone Encounter (Signed)
Contacted patient re:  cxr + for PNA. Got answering machine. Left detailed message to begin avelox, call if symptoms worsen or do not improve.  Follow up in 1 week.    Please contact pt in AM and confirm that she got my message. thanks

## 2016-02-05 NOTE — Progress Notes (Signed)
Subjective:    Patient ID: Natalie Kane, female    DOB: January 09, 1981, 35 y.o.   MRN: [id number]  HPI  Natalie Kane is a 35 yr old female who presents today for follow up of her bronchitis.  She saw Evern Core on 01/30/16 and was diagnosed with bronchitis. She underwent CXR at that time which was negative for pneumonia.  She was treated with azithromycin, hycodan, flovent and albuterol. She reports fever last Wednesday.  On Sat tmax 99.6, resolved with tylenol.  She completed zpak. Cough is wet in the AM and dry later in the day.    Review of Systems See HPI  Past Medical History:  Diagnosis Date  . Chicken pox   . Fibroids    microscopic  . Headache(784.0)   . Hypertriglyceridemia 12/05/2015  . Pregnancy induced hypertension      Social History   Social History  . Marital status: Single    Spouse name: N/A  . Number of children: N/A  . Years of education: N/A   Occupational History  . Not on file.   Social History Main Topics  . Smoking status: Never Smoker  . Smokeless tobacco: Never Used  . Alcohol use No  . Drug use: No  . Sexual activity: Yes   Other Topics Concern  . 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     Past Surgical History:  Procedure Laterality Date  . CESAREAN SECTION  03/13/2011   Procedure: CESAREAN SECTION;  Surgeon: Shon Millet II;  Location: [facility];  Service: Gynecology;  Laterality: N/A;  . CHOLECYSTECTOMY N/A 10/17/2015   Procedure: LAPAROSCOPIC CHOLECYSTECTOMY;  Surgeon: Arta Bruce Kinsinger, MD;  Location: [facility];  Service: General;  Laterality: N/A;    Family History  Problem Relation Age of Onset  . Hypertension Father   . Cancer Father     prostate  . Stroke Maternal Grandmother   . Cancer Paternal Grandmother     colon  . Anesthesia problems Neg Hx   . Hypotension Neg Hx   . Malignant hyperthermia Neg Hx     . Pseudochol deficiency Neg Hx     No Known Allergies  Current Outpatient Prescriptions on File Prior to Visit  Medication Sig Dispense Refill  . albuterol (PROVENTIL HFA;VENTOLIN HFA) 108 (90 Base) MCG/ACT inhaler Inhale 2 puffs into the lungs every 6 (six) hours as needed for wheezing or shortness of breath. 1 Inhaler 0  . NON FORMULARY Take 2 tablets by mouth 2 (two) times daily. Wheat grass tablet    . norgestimate-ethinyl estradiol (ORTHO-CYCLEN,SPRINTEC,PREVIFEM) 0.25-35 MG-MCG tablet Take 1 tablet by mouth daily. Reported on 05/14/2015 1 Package 11  . ondansetron (ZOFRAN ODT) 8 MG disintegrating tablet Take 1 tablet (8 mg total) by mouth every 8 (eight) hours as needed for nausea or vomiting. 15 tablet 0  . zolmitriptan (ZOMIG-ZMT) 2.5 MG disintegrating tablet Take 1 tablet (2.5 mg total) by mouth as needed for migraine. You may repeat once in 2 hours after 1st dose 10 tablet 0   No current facility-administered medications on file prior to visit.     BP 128/79 (BP Location: Right Arm, Cuff Size: Large)   Pulse 79   Temp 98.9 F (37.2 C) (Oral)   Resp 18   Ht 6' (1.829 m)   Wt 251 lb 12.8 oz (114.2 kg)  LMP 01/16/2016   SpO2 100% Comment: room air  BMI 34.15 kg/m        Objective:   Physical Exam  Constitutional: She is oriented to person, place, and time. She appears well-developed and well-nourished.  Cardiovascular: Normal rate, regular rhythm and normal heart sounds.   No murmur heard. Pulmonary/Chest: Effort normal. No respiratory distress. She has wheezes.  Musculoskeletal: She exhibits no edema.  Neurological: She is alert and oriented to person, place, and time.  Psychiatric: She has a normal mood and affect. Her behavior is normal. Judgment and thought content normal.          Assessment & Plan:  Pneumonia- CXR is performed- notes atx versus early infiltrate LUL. Lungs are tight today on exam. Will rx with pred taper, avelox, tessalon prn cough.   Continue flovent twice daily and add albuterol (inhaler or nebulizer) every 6 hours for the next few days then as needed. Call if symptoms worsen or symptoms are not improved in 3 days.

## 2016-02-05 NOTE — Progress Notes (Signed)
Pre visit review using our clinic review tool, if applicable. No additional management support is needed unless otherwise documented below in the visit note. 

## 2016-02-05 NOTE — Patient Instructions (Signed)
Complete chest x ray on the first floor.  Please begin prednisone taper for asthma flare. You may use tessalon 3x daily as needed for cough. Continue flovent twice daily and add albuterol (inhaler or nebulizer) every 6 hours for the next few days then as needed. Call if symptoms worsen or symptoms are not improved in 3 days.

## 2016-02-06 ENCOUNTER — Ambulatory Visit: Payer: BC Managed Care – PPO | Admitting: Medical

## 2016-02-06 ENCOUNTER — Encounter: Payer: Self-pay | Admitting: Family

## 2016-02-06 NOTE — Telephone Encounter (Signed)
See 02/06/16 patient email.

## 2016-02-06 NOTE — Telephone Encounter (Signed)
Yes

## 2016-02-06 NOTE — Telephone Encounter (Signed)
Notified pt and scheduled f/u for 02/13/16. Pt wanted to know if she should get a note to extend her time out of work?  Please advise?

## 2016-02-07 ENCOUNTER — Other Ambulatory Visit: Payer: Self-pay | Admitting: Family

## 2016-02-07 NOTE — Progress Notes (Deleted)
ycod

## 2016-02-12 ENCOUNTER — Ambulatory Visit (INDEPENDENT_AMBULATORY_CARE_PROVIDER_SITE_OTHER): Payer: BC Managed Care – PPO | Admitting: Family

## 2016-02-12 ENCOUNTER — Encounter: Payer: Self-pay | Admitting: Family

## 2016-02-12 ENCOUNTER — Ambulatory Visit (HOSPITAL_BASED_OUTPATIENT_CLINIC_OR_DEPARTMENT_OTHER)
Admission: RE | Admit: 2016-02-12 | Discharge: 2016-02-12 | Disposition: A | Discharge status: Home / Self Care | Payer: BC Managed Care – PPO | Source: Ambulatory Visit | Attending: Family | Admitting: Family

## 2016-02-12 VITALS — BP 131/72 | HR 100 | Temp 98.5°F | Resp 18 | Ht 72.0 in | Wt 249.0 lb

## 2016-02-12 DIAGNOSIS — J181 Lobar pneumonia, unspecified organism: Secondary | ICD-10-CM

## 2016-02-12 DIAGNOSIS — R197 Diarrhea, unspecified: Secondary | ICD-10-CM | POA: Diagnosis not present

## 2016-02-12 DIAGNOSIS — J189 Pneumonia, unspecified organism: Secondary | ICD-10-CM

## 2016-02-12 DIAGNOSIS — Z8701 Personal history of pneumonia (recurrent): Secondary | ICD-10-CM | POA: Insufficient documentation

## 2016-02-12 DIAGNOSIS — R5383 Other fatigue: Secondary | ICD-10-CM

## 2016-02-12 MED ORDER — BUDESONIDE-FORMOTEROL FUMARATE 160-4.5 MCG/ACT IN AERO: 2.0000 | INHALATION_SPRAY | Freq: Two times a day (BID) | RESPIRATORY_TRACT | 12 refills | Status: DC

## 2016-02-12 NOTE — Progress Notes (Signed)
Subjective:    Patient ID: Natalie Kane, female    DOB: Jun 20, 1980, 35 y.o.   MRN: [id number]  HPI  Natalie Kane is a 35 yr old female who presents today for follow up of her pneumonia. Patient was treated with avelox. Reports that she still has a "rumble" when I cough.  Her main complaint today is fatigue. This AM she started vomiting and having diarrhea.  She is on her last day of antibiotics.  Denies known fever. She continues to have a dry cough.  Denies nasal drainage. She  continues albuterol and has almost completed pred taper. She reports some sob with she exerts herself.      Review of Systems See HPI  Past Medical History:  Diagnosis Date  . Chicken pox   . Fibroids    microscopic  . Headache(784.0)   . Hypertriglyceridemia 12/05/2015  . Pregnancy induced hypertension      Social History   Social History  . Marital status: Single    Spouse name: N/A  . Number of children: N/A  . Years of education: N/A   Occupational History  . Not on file.   Social History Main Topics  . Smoking status: Never Smoker  . Smokeless tobacco: Never Used  . Alcohol use No  . Drug use: No  . Sexual activity: Yes   Other Topics Concern  . 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     Past Surgical History:  Procedure Laterality Date  . CESAREAN SECTION  03/13/2011   Procedure: CESAREAN SECTION;  Surgeon: Shon Millet II;  Location: [facility];  Service: Gynecology;  Laterality: N/A;  . CHOLECYSTECTOMY N/A 10/17/2015   Procedure: LAPAROSCOPIC CHOLECYSTECTOMY;  Surgeon: Arta Bruce Kinsinger, MD;  Location: [facility];  Service: General;  Laterality: N/A;    Family History  Problem Relation Age of Onset  . Hypertension Father   . Cancer Father     prostate  . Stroke Maternal Grandmother   . Cancer Paternal Grandmother     colon  . Anesthesia problems Neg Hx   .  Hypotension Neg Hx   . Malignant hyperthermia Neg Hx   . Pseudochol deficiency Neg Hx     Allergies  Allergen Reactions  . Hydrocodone-Homatropine     vomitting    Current Outpatient Prescriptions on File Prior to Visit  Medication Sig Dispense Refill  . albuterol (PROVENTIL HFA;VENTOLIN HFA) 108 (90 Base) MCG/ACT inhaler Inhale 2 puffs into the lungs every 6 (six) hours as needed for wheezing or shortness of breath. 1 Inhaler 0  . albuterol (PROVENTIL) (2.5 MG/3ML) 0.083% nebulizer solution Take 3 mLs (2.5 mg total) by nebulization every 6 (six) hours as needed for wheezing or shortness of breath. 75 mL 3  . benzonatate (TESSALON) 100 MG capsule Take 1 capsule (100 mg total) by mouth 3 (three) times daily as needed. 20 capsule 0  . NON FORMULARY Take 2 tablets by mouth 2 (two) times daily. Wheat grass tablet    . norgestimate-ethinyl estradiol (ORTHO-CYCLEN,SPRINTEC,PREVIFEM) 0.25-35 MG-MCG tablet Take 1 tablet by mouth daily. Reported on 05/14/2015 1 Package 11  . ondansetron (ZOFRAN ODT) 8 MG disintegrating tablet Take 1 tablet (8 mg total) by mouth every 8 (eight) hours as needed for nausea or vomiting. 15 tablet 0  . predniSONE (DELTASONE) 10 MG tablet 4 tabs by mouth once  daily for 2 days, then 3 tabs daily x 2 days, then 2 tabs daily x 2 days, then 1 tab daily x 2 days 20 tablet 0   No current facility-administered medications on file prior to visit.     BP 131/72 (BP Location: Right Arm, Patient Position: Sitting, Cuff Size: Large)   Pulse 100   Temp 98.5 F (36.9 C) (Oral)   Resp 18   Ht 6' (1.829 m)   Wt 249 lb (112.9 kg)   LMP 01/23/2016   SpO2 100% Comment: room air  BMI 33.77 kg/m       Objective:   Physical Exam  Constitutional: She appears well-developed and well-nourished.  HENT:  Head: Normocephalic and atraumatic.  Right Ear: Tympanic membrane and ear canal normal.  Left Ear: Tympanic membrane and ear canal normal.  Mouth/Throat: No oropharyngeal exudate,  posterior oropharyngeal edema or posterior oropharyngeal erythema.  Cardiovascular: Normal rate, regular rhythm and normal heart sounds.   No murmur heard. Pulmonary/Chest: Effort normal. No respiratory distress. She has wheezes in the left upper field.  Lymphadenopathy:    She has no cervical adenopathy.  Psychiatric: She has a normal mood and affect. Her behavior is normal. Judgment and thought content normal.          Assessment & Plan:  Community Acquired Pneumonia- lung exam is improving, but still having some wheezing. Will add symbicort and have her complete the last few days of oral prednisone. Obtain follow up CXR.   Diarrhea- ? Side effect of avelox. ? Viral gastroenteritis or ? (less likely) C. Diff. Advised pt to call if symptoms worsen or do not improve. OK to take prn imodium.  If symptoms persist will need stool studies.   Fatigue- ? Related to PNA.will check some baseline laboratories to further assess. I expect that WBC will be elevated due to recent steroid use, but would like to evaluate for anemia.

## 2016-02-12 NOTE — Patient Instructions (Addendum)
Please complete lab work prior to leaving. Complete chest x ray on the first floor. You may use imodium as needed for diarrhea.  Let me know if your diarrhea worsens or if it is not improved in 2 days.  Add symbicort 2 puffs twice daily for asthma symptoms. You may still use albuterol as needed for wheezing. Complete prednisone. Follow up in 2 weeks, Call sooner if symptoms do not continue to improve.

## 2016-02-12 NOTE — Progress Notes (Signed)
Pre visit review using our clinic review tool, if applicable. No additional management support is needed unless otherwise documented below in the visit note. 

## 2016-02-13 ENCOUNTER — Encounter: Payer: Self-pay | Admitting: Family

## 2016-02-13 ENCOUNTER — Ambulatory Visit: Payer: BC Managed Care – PPO | Admitting: Family

## 2016-02-13 ENCOUNTER — Other Ambulatory Visit: Payer: Self-pay | Admitting: Family

## 2016-02-13 LAB — CBC WITH DIFFERENTIAL/PLATELET
Basophils Absolute: 0 10*3/uL (ref 0.0–0.1)
Basophils Relative: 0.1 % (ref 0.0–3.0)
Eosinophils Absolute: 0.1 10*3/uL (ref 0.0–0.7)
Eosinophils Relative: 1.2 % (ref 0.0–5.0)
HCT: 35 % — ABNORMAL LOW (ref 36.0–46.0)
Hemoglobin: 11.8 g/dL — ABNORMAL LOW (ref 12.0–15.0)
Lymphocytes Relative: 7.5 % — ABNORMAL LOW (ref 12.0–46.0)
Lymphs Abs: 0.8 10*3/uL (ref 0.7–4.0)
MCHC: 33.8 g/dL (ref 30.0–36.0)
MCV: 88.9 fl (ref 78.0–100.0)
Monocytes Absolute: 0.4 10*3/uL (ref 0.1–1.0)
Monocytes Relative: 3.4 % (ref 3.0–12.0)
Neutro Abs: 9.1 10*3/uL — ABNORMAL HIGH (ref 1.4–7.7)
Neutrophils Relative %: 87.8 % — ABNORMAL HIGH (ref 43.0–77.0)
Platelets: 434 10*3/uL — ABNORMAL HIGH (ref 150.0–400.0)
RBC: 3.94 Mil/uL (ref 3.87–5.11)
RDW: 13.6 % (ref 11.5–15.5)
WBC: 10.4 10*3/uL (ref 4.0–10.5)

## 2016-02-13 LAB — BASIC METABOLIC PANEL
BUN: 8 mg/dL (ref 6–23)
CO2: 25 mEq/L (ref 19–32)
Calcium: 8.8 mg/dL (ref 8.4–10.5)
Chloride: 103 mEq/L (ref 96–112)
Creatinine, Ser: 0.75 mg/dL (ref 0.40–1.20)
GFR: 113.2 mL/min (ref >60.00)
Glucose, Bld: 92 mg/dL (ref 70–99)
Potassium: 3.9 mEq/L (ref 3.5–5.1)
Sodium: 137 mEq/L (ref 135–145)

## 2016-02-13 LAB — TSH: TSH: 0.88 u[IU]/mL (ref 0.35–4.50)

## 2016-02-13 MED ORDER — FLUCONAZOLE 150 MG PO TABS: ORAL_TABLET | ORAL | 0 refills | Status: DC

## 2016-02-13 NOTE — Progress Notes (Signed)
ifluca 150

## 2016-02-20 ENCOUNTER — Other Ambulatory Visit: Payer: Self-pay | Admitting: Family

## 2016-02-20 MED ORDER — METRONIDAZOLE 0.75 % VA GEL: 1.0000 | Freq: Every day | VAGINAL | 0 refills | Status: DC

## 2016-02-20 MED ORDER — METRONIDAZOLE 500 MG PO TABS: 500.0000 mg | ORAL_TABLET | Freq: Two times a day (BID) | ORAL | 0 refills | Status: DC

## 2016-07-09 ENCOUNTER — Encounter: Payer: Self-pay | Admitting: Family

## 2016-07-10 ENCOUNTER — Other Ambulatory Visit: Payer: Self-pay | Admitting: Family

## 2016-07-10 MED ORDER — IBUPROFEN 800 MG PO TABS: 800.0000 mg | ORAL_TABLET | Freq: Three times a day (TID) | ORAL | 0 refills | Status: DC | PRN

## 2016-10-19 ENCOUNTER — Encounter: Payer: Self-pay | Admitting: Family

## 2016-10-20 NOTE — Telephone Encounter (Signed)
Patient will need OV please.

## 2016-10-20 NOTE — Telephone Encounter (Signed)
Detailed message left on pt's cell# and mychart message sent to pt.

## 2016-10-27 ENCOUNTER — Other Ambulatory Visit: Payer: Self-pay | Admitting: Family

## 2017-02-12 NOTE — Progress Notes (Deleted)
[facility] Note  02/13/2017     CHIEF COMPLAINT Patient presents for No chief complaint on file.   HISTORY OF PRESENT ILLNESS: Natalie Kane is a 36 y.o. female who presents to the clinic today for:     Referring physician: Harvie Heck [place], [place] [id number]  HISTORICAL INFORMATION:   Selected notes from the MEDICAL RECORD NUMBER Referral from Dr. 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 (Ophthalmic Drugs)   No current facility-administered medications for this visit.  (Ophthalmic Drugs)   Current Outpatient Prescriptions (Other)  Medication Sig   albuterol (PROVENTIL HFA;VENTOLIN HFA) 108 (90 Base) MCG/ACT inhaler Inhale 2 puffs into the lungs every 6 (six) hours as needed for wheezing or shortness of breath.   albuterol (PROVENTIL) (2.5 MG/3ML) 0.083% nebulizer solution Take 3 mLs (2.5 mg total) by nebulization every 6 (six) hours as needed for wheezing or shortness of breath.   benzonatate (TESSALON) 100 MG capsule Take 1 capsule (100 mg total) by mouth 3 (three) times daily as needed.   budesonide-formoterol (SYMBICORT) 160-4.5 MCG/ACT inhaler Inhale 2 puffs into the lungs 2 (two) times daily.   fluconazole (DIFLUCAN) 150 MG tablet 1 tab by mouth today for yeast infection, may repeat in 3 days as needed   ibuprofen (ADVIL,MOTRIN) 800 MG tablet Take 1 tablet (800 mg total) by mouth every 8 (eight) hours as needed.   metroNIDAZOLE (FLAGYL) 500 MG tablet Take 1 tablet (500 mg total) by mouth 2 (two) times daily.   NON FORMULARY Take 2 tablets by mouth 2 (two) times daily. Wheat grass tablet   ondansetron (ZOFRAN ODT) 8 MG disintegrating tablet Take 1 tablet (8 mg total) by mouth every 8 (eight) hours as needed for nausea or vomiting.   predniSONE (DELTASONE) 10 MG tablet 4 tabs by mouth  once daily for 2 days, then 3 tabs daily x 2 days, then 2 tabs daily x 2 days, then 1 tab daily x 2 days   SPRINTEC 28 0.25-35 MG-MCG tablet TAKE 1 TABLET BY MOUTH EVERY DAY   No current facility-administered medications for this visit.  (Other)      REVIEW OF SYSTEMS:    ALLERGIES Allergies  Allergen Reactions   Hydrocodone-Homatropine     vomitting    PAST MEDICAL HISTORY Past Medical History:  Diagnosis Date   Chicken pox    Fibroids    microscopic   Headache(784.0)    Hypertriglyceridemia 12/05/2015   Pregnancy induced hypertension    Past Surgical History:  Procedure Laterality Date   CESAREAN SECTION  03/13/2011   Procedure: CESAREAN SECTION;  Surgeon: Shon Millet II;  Location: [facility];  Service: Gynecology;  Laterality: N/A;   CHOLECYSTECTOMY N/A 10/17/2015   Procedure: LAPAROSCOPIC CHOLECYSTECTOMY;  Surgeon: Arta Bruce Kinsinger, MD;  Location: [facility];  Service: General;  Laterality: N/A;    FAMILY HISTORY Family History  Problem Relation Age of Onset   Hypertension Father    Cancer Father        prostate   Stroke Maternal Grandmother    Cancer Paternal Grandmother        colon   Anesthesia problems Neg Hx    Hypotension Neg Hx    Malignant hyperthermia Neg Hx    Pseudochol deficiency Neg Hx     SOCIAL HISTORY Social History  Substance Use Topics   Smoking status: Never Smoker   Smokeless tobacco: Never Used   Alcohol use No         OPHTHALMIC EXAM:   Not recorded      IMAGING AND PROCEDURES  Imaging and Procedures for 02/12/17           ASSESSMENT/PLAN:    ICD-10-CM   1. Retinal edema H35.81 OCT, Retina - OU - Both Eyes    1.  2.  3.  Ophthalmic Meds Ordered this visit:  No orders of the defined types were placed in this encounter.      No Follow-up on file.  There are no Patient Instructions on file for this visit.   Explained the diagnoses, plan, and follow up with the patient and they  expressed understanding.  Patient expressed understanding of the importance of proper follow up care.   Gardiner Sleeper, M.D., Ph.D. Diseases & Surgery of the Retina and Vitreous [facility] 02/12/17     Abbreviations: M myopia (nearsighted); A astigmatism; H hyperopia (farsighted); P presbyopia; Mrx spectacle prescription;  CTL contact lenses; OD right eye; OS left eye; OU both eyes  XT exotropia; ET esotropia; PEK punctate epithelial keratitis; PEE punctate epithelial erosions; DES dry eye syndrome; MGD meibomian gland dysfunction; ATs artificial tears; PFAT's preservative free artificial tears; [place] nuclear sclerotic cataract; PSC posterior subcapsular cataract; ERM epi-retinal membrane; PVD posterior vitreous detachment; RD retinal detachment; DM diabetes mellitus; DR diabetic retinopathy; NPDR non-proliferative diabetic retinopathy; PDR proliferative diabetic retinopathy; CSME clinically significant macular edema; DME diabetic macular edema; dbh dot blot hemorrhages; CWS cotton wool spot; POAG primary open angle glaucoma; C/D cup-to-disc ratio; HVF humphrey visual field; GVF goldmann visual field; OCT optical coherence tomography; IOP intraocular pressure; BRVO Branch retinal vein occlusion; CRVO central retinal vein occlusion; CRAO central retinal artery occlusion; BRAO branch retinal artery occlusion; RT retinal tear; SB scleral buckle; PPV pars plana vitrectomy; VH Vitreous hemorrhage; PRP panretinal laser photocoagulation; IVK intravitreal kenalog; VMT vitreomacular traction; MH Macular hole;  NVD neovascularization of the disc; NVE neovascularization elsewhere; AREDS age related eye disease study; ARMD age related macular degeneration; POAG primary open angle glaucoma; EBMD epithelial/anterior basement membrane dystrophy; ACIOL anterior chamber intraocular lens; IOL intraocular lens; PCIOL posterior chamber intraocular lens; Phaco/IOL phacoemulsification with intraocular  lens placement; [place] photorefractive keratectomy; LASIK laser assisted in situ keratomileusis; HTN hypertension; DM diabetes mellitus; COPD chronic obstructive pulmonary disease

## 2017-02-13 ENCOUNTER — Encounter (INDEPENDENT_AMBULATORY_CARE_PROVIDER_SITE_OTHER): Payer: Self-pay | Admitting: Ophthalmology

## 2017-02-18 NOTE — Progress Notes (Deleted)
[facility] Note  02/19/2017     CHIEF COMPLAINT Patient presents for No chief complaint on file.   HISTORY OF PRESENT ILLNESS: Natalie Kane is a 36 y.o. female who presents to the clinic today for:     Referring physician: Harvie Heck, [place], [place] [id number]  HISTORICAL INFORMATION:   Selected notes from the MEDICAL RECORD NUMBER Referral from Dr. 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 (Ophthalmic Drugs)   No current facility-administered medications for this visit.  (Ophthalmic Drugs)   Current Outpatient Prescriptions (Other)  Medication Sig   albuterol (PROVENTIL HFA;VENTOLIN HFA) 108 (90 Base) MCG/ACT inhaler Inhale 2 puffs into the lungs every 6 (six) hours as needed for wheezing or shortness of breath.   albuterol (PROVENTIL) (2.5 MG/3ML) 0.083% nebulizer solution Take 3 mLs (2.5 mg total) by nebulization every 6 (six) hours as needed for wheezing or shortness of breath.   benzonatate (TESSALON) 100 MG capsule Take 1 capsule (100 mg total) by mouth 3 (three) times daily as needed.   budesonide-formoterol (SYMBICORT) 160-4.5 MCG/ACT inhaler Inhale 2 puffs into the lungs 2 (two) times daily.   fluconazole (DIFLUCAN) 150 MG tablet 1 tab by mouth today for yeast infection, may repeat in 3 days as needed   ibuprofen (ADVIL,MOTRIN) 800 MG tablet Take 1 tablet (800 mg total) by mouth every 8 (eight) hours as needed.   metroNIDAZOLE (FLAGYL) 500 MG tablet Take 1 tablet (500 mg total) by mouth 2 (two) times daily.   NON FORMULARY Take 2 tablets by mouth 2 (two) times daily. Wheat grass tablet   ondansetron (ZOFRAN ODT) 8 MG disintegrating tablet Take 1 tablet (8 mg total) by mouth every 8 (eight) hours as needed for nausea or vomiting.   predniSONE (DELTASONE) 10 MG  tablet 4 tabs by mouth once daily for 2 days, then 3 tabs daily x 2 days, then 2 tabs daily x 2 days, then 1 tab daily x 2 days   SPRINTEC 28 0.25-35 MG-MCG tablet TAKE 1 TABLET BY MOUTH EVERY DAY   No current facility-administered medications for this visit.  (Other)      REVIEW OF SYSTEMS:    ALLERGIES Allergies  Allergen Reactions   Hydrocodone-Homatropine     vomitting    PAST MEDICAL HISTORY Past Medical History:  Diagnosis Date   Chicken pox    Fibroids    microscopic   Headache(784.0)    Hypertriglyceridemia 12/05/2015   Pregnancy induced hypertension    Past Surgical History:  Procedure Laterality Date   CESAREAN SECTION  03/13/2011   Procedure: CESAREAN SECTION;  Surgeon: Shon Millet II;  Location: [facility];  Service: Gynecology;  Laterality: N/A;   CHOLECYSTECTOMY N/A 10/17/2015   Procedure: LAPAROSCOPIC CHOLECYSTECTOMY;  Surgeon: Arta Bruce Kinsinger, MD;  Location: [facility];  Service: General;  Laterality: N/A;    FAMILY HISTORY Family History  Problem Relation Age of Onset   Hypertension Father    Cancer Father        prostate   Stroke Maternal Grandmother    Cancer Paternal Grandmother        colon   Anesthesia problems Neg Hx    Hypotension Neg Hx    Malignant hyperthermia Neg Hx    Pseudochol deficiency Neg Hx     SOCIAL HISTORY Social  History  Substance Use Topics   Smoking status: Never Smoker   Smokeless tobacco: Never Used   Alcohol use No         OPHTHALMIC EXAM:   Not recorded      IMAGING AND PROCEDURES  Imaging and Procedures for 02/18/17           ASSESSMENT/PLAN:    ICD-10-CM   1. Retinal edema H35.81 OCT, Retina - OU - Both Eyes    1.  2.  3.  Ophthalmic Meds Ordered this visit:  No orders of the defined types were placed in this encounter.      No Follow-up on file.  There are no Patient Instructions on file for this visit.   Explained the diagnoses, plan, and follow up  with the patient and they expressed understanding.  Patient expressed understanding of the importance of proper follow up care.   Gardiner Sleeper, M.D., Ph.D. Diseases & Surgery of the Retina and Vitreous [facility] 02/18/17     Abbreviations: M myopia (nearsighted); A astigmatism; H hyperopia (farsighted); P presbyopia; Mrx spectacle prescription;  CTL contact lenses; OD right eye; OS left eye; OU both eyes  XT exotropia; ET esotropia; PEK punctate epithelial keratitis; PEE punctate epithelial erosions; DES dry eye syndrome; MGD meibomian gland dysfunction; ATs artificial tears; PFAT's preservative free artificial tears; [place] nuclear sclerotic cataract; PSC posterior subcapsular cataract; ERM epi-retinal membrane; PVD posterior vitreous detachment; RD retinal detachment; DM diabetes mellitus; DR diabetic retinopathy; NPDR non-proliferative diabetic retinopathy; PDR proliferative diabetic retinopathy; CSME clinically significant macular edema; DME diabetic macular edema; dbh dot blot hemorrhages; CWS cotton wool spot; POAG primary open angle glaucoma; C/D cup-to-disc ratio; HVF humphrey visual field; GVF goldmann visual field; OCT optical coherence tomography; IOP intraocular pressure; BRVO Branch retinal vein occlusion; CRVO central retinal vein occlusion; CRAO central retinal artery occlusion; BRAO branch retinal artery occlusion; RT retinal tear; SB scleral buckle; PPV pars plana vitrectomy; VH Vitreous hemorrhage; PRP panretinal laser photocoagulation; IVK intravitreal kenalog; VMT vitreomacular traction; MH Macular hole;  NVD neovascularization of the disc; NVE neovascularization elsewhere; AREDS age related eye disease study; ARMD age related macular degeneration; POAG primary open angle glaucoma; EBMD epithelial/anterior basement membrane dystrophy; ACIOL anterior chamber intraocular lens; IOL intraocular lens; PCIOL posterior chamber intraocular lens; Phaco/IOL  phacoemulsification with intraocular lens placement; [place] photorefractive keratectomy; LASIK laser assisted in situ keratomileusis; HTN hypertension; DM diabetes mellitus; COPD chronic obstructive pulmonary disease

## 2017-02-19 ENCOUNTER — Encounter (INDEPENDENT_AMBULATORY_CARE_PROVIDER_SITE_OTHER): Payer: BC Managed Care – PPO | Admitting: Ophthalmology

## 2017-04-15 ENCOUNTER — Other Ambulatory Visit: Payer: Self-pay | Admitting: Family

## 2017-04-15 NOTE — Telephone Encounter (Signed)
30 day supply of sprintec sent to pharmacy. Pt is due for CPE soon and should be seen before further refills are needed. Sent mychart message to pt.

## 2017-04-24 ENCOUNTER — Ambulatory Visit: Payer: BC Managed Care – PPO | Admitting: Medical

## 2017-04-24 ENCOUNTER — Encounter: Payer: Self-pay | Admitting: Family

## 2017-04-24 ENCOUNTER — Telehealth: Payer: BC Managed Care – PPO | Admitting: Family

## 2017-04-24 DIAGNOSIS — B373 Candidiasis of vulva and vagina: Secondary | ICD-10-CM

## 2017-04-24 DIAGNOSIS — B3731 Acute candidiasis of vulva and vagina: Secondary | ICD-10-CM

## 2017-04-24 MED ORDER — FLUCONAZOLE 150 MG PO TABS: 150.0000 mg | ORAL_TABLET | Freq: Once | ORAL | 0 refills | Status: AC

## 2017-04-24 MED ORDER — FLUCONAZOLE 150 MG PO TABS: ORAL_TABLET | ORAL | 0 refills | Status: DC

## 2017-04-24 MED ORDER — FLUCONAZOLE 150 MG PO TABS: 150.0000 mg | ORAL_TABLET | Freq: Once | ORAL | 0 refills | Status: DC

## 2017-04-24 NOTE — Progress Notes (Signed)

## 2017-04-24 NOTE — Telephone Encounter (Signed)
Done because she has appt coming up. She needs to f/u if she is not improving though. TY.

## 2017-04-24 NOTE — Addendum Note (Signed)
Addended by: Benjamine Mola on: 04/24/2017 11:27 AM   Modules accepted: Orders

## 2017-04-27 NOTE — Telephone Encounter (Signed)
Pt completed an E-visit for her symptoms.

## 2017-04-29 ENCOUNTER — Encounter: Payer: Self-pay | Admitting: Family Medicine

## 2017-04-29 ENCOUNTER — Other Ambulatory Visit (HOSPITAL_COMMUNITY)
Admission: RE | Admit: 2017-04-29 | Discharge: 2017-04-29 | Disposition: A | Discharge status: Home / Self Care | Payer: BC Managed Care – PPO | Source: Ambulatory Visit | Attending: Family Medicine | Admitting: Family Medicine

## 2017-04-29 ENCOUNTER — Ambulatory Visit: Payer: BC Managed Care – PPO | Admitting: Family Medicine

## 2017-04-29 VITALS — BP 132/86 | HR 91 | Temp 98.1°F | Ht 72.0 in | Wt 251.2 lb

## 2017-04-29 DIAGNOSIS — L298 Other pruritus: Secondary | ICD-10-CM | POA: Diagnosis not present

## 2017-04-29 DIAGNOSIS — N898 Other specified noninflammatory disorders of vagina: Secondary | ICD-10-CM | POA: Insufficient documentation

## 2017-04-29 LAB — POC URINALSYSI DIPSTICK (AUTOMATED)
Bilirubin, UA: NEGATIVE
Blood, UA: NEGATIVE
Glucose, UA: NEGATIVE
Ketones, UA: NEGATIVE
Leukocytes, UA: NEGATIVE
Nitrite, UA: NEGATIVE
Protein, UA: NEGATIVE
Spec Grav, UA: 1.01 (ref 1.010–1.025)
Urobilinogen, UA: 0.2 E.U./dL
pH, UA: 6 (ref 5.0–8.0)

## 2017-04-29 MED ORDER — METRONIDAZOLE 500 MG PO TABS: 500.0000 mg | ORAL_TABLET | Freq: Two times a day (BID) | ORAL | 0 refills | Status: DC

## 2017-04-29 NOTE — Progress Notes (Signed)
Pre visit review using our clinic review tool, if applicable. No additional management support is needed unless otherwise documented below in the visit note. 

## 2017-04-29 NOTE — Progress Notes (Signed)
Chief Complaint  Patient presents with  . Vaginitis    Natalie Kane is a 37 y.o. female here for vaginal itching.  Duration: 6 days Denies itching Odor: Slight New sexual partner: No Urinary complaints: No IUD? No Denies fevers, bleeding, pregnancy abdominal pain.  ROS:  GU: +discharge, denies pain with urination  Past Medical History:  Diagnosis Date  . Chicken pox   . Fibroids    microscopic  . Headache(784.0)   . Hypertriglyceridemia 12/05/2015  . Pregnancy induced hypertension    BP 132/86 (BP Location: Left Arm, Patient Position: Sitting, Cuff Size: Large)   Pulse 91   Temp 98.1 F (36.7 C) (Oral)   Ht 6' (1.829 m)   Wt 251 lb 4 oz (114 kg)   SpO2 96%   BMI 34.08 kg/m  Gen: Awake, alert, appears stated age Heart: RRR, no murmurs Lungs: CTAB, no accessory muscle use Abd: BS+, soft, NT, ND, no masses or organomegaly GU: Deferred Psych: Age appropriate judgment and insight, nml mood and affect  Vaginal itching - Plan: POCT Urinalysis Dipstick (Automated), metroNIDAZOLE (FLAGYL) 500 MG tablet, Urine cytology ancillary only  Orders as above. Tx empirically, will chk urine also.  F/u prn. Pt voiced understanding and agreement to the plan.  [place], DO 04/29/17 1:33 PM

## 2017-04-29 NOTE — Patient Instructions (Addendum)
We will let you know the results of your urine testing via MyChart.  Do not drink alcohol on this medicine.   Let us know if you need anything.

## 2017-04-30 LAB — URINE CYTOLOGY ANCILLARY ONLY: Trichomonas: NEGATIVE

## 2017-05-04 LAB — URINE CYTOLOGY ANCILLARY ONLY: Candida vaginitis: NEGATIVE

## 2017-05-11 ENCOUNTER — Telehealth: Payer: Self-pay | Admitting: Family

## 2017-05-11 ENCOUNTER — Other Ambulatory Visit: Payer: Self-pay | Admitting: Family

## 2017-05-11 NOTE — Telephone Encounter (Signed)
Refill sent but pt will need to schedule a CPX for additional refills.

## 2017-05-11 NOTE — Telephone Encounter (Signed)
Pt last seen by PCP 01/2016.  Recently seen by Dr Nani Ravens for acute visit on 04/29/17.  Please advise Sprintec request?

## 2017-05-19 ENCOUNTER — Encounter: Payer: Self-pay | Admitting: Internal Medicine

## 2017-05-19 ENCOUNTER — Ambulatory Visit: Payer: BC Managed Care – PPO | Admitting: Internal Medicine

## 2017-05-19 VITALS — BP 116/70 | HR 83 | Temp 98.1°F | Resp 14 | Ht 72.0 in | Wt 251.0 lb

## 2017-05-19 DIAGNOSIS — J069 Acute upper respiratory infection, unspecified: Secondary | ICD-10-CM

## 2017-05-19 MED ORDER — AZELASTINE HCL 0.1 % NA SOLN: 2.0000 | Freq: Every evening | NASAL | 3 refills | Status: DC | PRN

## 2017-05-19 NOTE — Patient Instructions (Signed)
Rest, fluids , tylenol  For cough:  Take Mucinex DM twice a day as needed until better  For nasal congestion: Use OTC   Flonase : 2 nasal sprays on each side of the nose in the morning until you feel better Use ASTELIN a prescribed spray : 2 nasal sprays on each side of the nose at night until you feel better    Call if not gradually better over the next  3-4  days  Call anytime if the symptoms are severe

## 2017-05-19 NOTE — Progress Notes (Signed)
Subjective:    Patient ID: Natalie Kane, female    DOB: [birth year], 37 y.o.   MRN: [id number]  DOS:  05/19/2017 Type of visit - description : acute  Interval history: Symptoms started 4 days ago with sore throat, increased pain  with swallowing, some chest and sinus congestion. 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   Review of Systems Subjective fever the first day, no chills. Some cough, productive only in the mornings.  + Nasal discharge. No nausea, vomiting, aches or pains.  Past Medical History:  Diagnosis Date  . Chicken pox   . Fibroids    microscopic  . Headache(784.0)   . Hypertriglyceridemia 12/05/2015  . Pregnancy induced hypertension     Past Surgical History:  Procedure Laterality Date  . CESAREAN SECTION  03/13/2011   Procedure: CESAREAN SECTION;  Surgeon: Shon Millet II;  Location: [facility];  Service: Gynecology;  Laterality: N/A;  . CHOLECYSTECTOMY N/A 10/17/2015   Procedure: LAPAROSCOPIC CHOLECYSTECTOMY;  Surgeon: Arta Bruce Kinsinger, MD;  Location: [facility];  Service: General;  Laterality: N/A;    Social History   Socioeconomic History  . Marital status: Single    Spouse name: Not on file  . Number of children: Not on file  . Years of education: Not on file  . Highest education level: Not on file  Social Needs  . Financial resource strain: Not on file  . Food insecurity - worry: Not on file  . Food insecurity - inability: Not on file  . Transportation needs - medical: Not on file  . Transportation needs - non-medical: Not on file  Occupational History  . Not on file  Tobacco Use  . Smoking status: Never Smoker  . Smokeless tobacco: Never Used  Substance and Sexual Activity  . Alcohol use: No  . Drug use: No  . Sexual activity: Yes  Other Topics Concern  . 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       Allergies as of 05/19/2017      Reactions   Hydrocodone-homatropine    vomitting      Medication List        Accurate as of 05/19/17  8:46 AM. Always use your most recent med list.          metroNIDAZOLE 500 MG tablet Commonly known as:  FLAGYL Take 1 tablet (500 mg total) by mouth 2 (two) times daily.   [place] 28 0.25-35 MG-MCG tablet Generic drug:  norgestimate-ethinyl estradiol TAKE 1 TABLET BY MOUTH EVERY DAY. NEED OFFICE VISIT FOR REFILLS          Objective:   Physical Exam BP 116/70 (BP Location: Left Arm, Patient Position: Sitting, Cuff Size: Normal)   Pulse 83   Temp 98.1 F (36.7 C) (Oral)   Resp 14   Ht 6' (1.829 m)   Wt 251 lb (113.9 kg)   LMP 05/12/2017 (Exact Date)   SpO2 98%   BMI 34.04 kg/m  General:   Well developed,   NAD.  HEENT:  Normocephalic . Face symmetric, atraumatic. TMs normal, nose is slightly congested, throat is symmetric without redness, no swelling. lungs:  CTA B Normal respiratory effort, no intercostal retractions, no accessory muscle use. Heart: RRR,  no murmur.  No pretibial edema bilaterally  Skin: Not pale. Not  jaundice Neurologic:  alert & oriented X3.  Speech normal, gait appropriate for age and unassisted Psych--  Cognition and judgment appear intact.  Cooperative with normal attention span and concentration.  Behavior appropriate. No anxious or depressed appearing.      Assessment & Plan:    36 year old female, healthy, on birth control pills, presents with: URI: No evidence of bacterial infection at this point, recommend conservative treatment, call if not better, ABX?

## 2017-05-19 NOTE — Progress Notes (Signed)
Pre visit review using our clinic review tool, if applicable. No additional management support is needed unless otherwise documented below in the visit note. 

## 2017-06-13 ENCOUNTER — Other Ambulatory Visit: Payer: Self-pay | Admitting: Family

## 2017-06-15 ENCOUNTER — Encounter: Payer: Self-pay | Admitting: Family

## 2017-06-15 NOTE — Telephone Encounter (Signed)
Attempted to reach pt and left message that there are a few openings this Thursday for a cpe if that would work for pt. Asked pt to send mychart response with best time for her. Awaiting pt response.

## 2017-06-23 ENCOUNTER — Encounter: Payer: Self-pay | Admitting: Internal Medicine

## 2017-06-23 ENCOUNTER — Ambulatory Visit: Payer: BC Managed Care – PPO | Admitting: Internal Medicine

## 2017-06-23 VITALS — BP 126/74 | HR 99 | Temp 99.3°F | Resp 14 | Ht 72.0 in | Wt 251.4 lb

## 2017-06-23 DIAGNOSIS — J101 Influenza due to other identified influenza virus with other respiratory manifestations: Secondary | ICD-10-CM | POA: Diagnosis not present

## 2017-06-23 LAB — POC INFLUENZA A&B (BINAX/QUICKVUE)
Influenza A, POC: POSITIVE — AB
Influenza B, POC: NEGATIVE

## 2017-06-23 MED ORDER — IBUPROFEN 800 MG PO TABS: 800.0000 mg | ORAL_TABLET | Freq: Three times a day (TID) | ORAL | 0 refills | Status: DC | PRN

## 2017-06-23 MED ORDER — OSELTAMIVIR PHOSPHATE 75 MG PO CAPS: 75.0000 mg | ORAL_CAPSULE | Freq: Two times a day (BID) | ORAL | 0 refills | Status: DC

## 2017-06-23 MED ORDER — BENZONATATE 200 MG PO CAPS: 200.0000 mg | ORAL_CAPSULE | Freq: Three times a day (TID) | ORAL | 0 refills | Status: DC | PRN

## 2017-06-23 NOTE — Progress Notes (Signed)
Subjective:    Patient ID: Natalie Kane, female    DOB: 1980-11-09, 37 y.o.   MRN: [id number]  DOS:  06/23/2017 Type of visit - description : acute Interval history: Symptoms of started 2 days ago: Sinus congestion, cough, sore throat, malaise. 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 Has taken OTCs with little help.   Review of Systems  + Headache, denies neck stiffness. Subjective fever at home, some chills No nausea or vomiting is + Generalized aches.  Past Medical History:  Diagnosis Date  . Chicken pox   . Fibroids    microscopic  . Headache(784.0)   . Hypertriglyceridemia 12/05/2015  . Pregnancy induced hypertension     Past Surgical History:  Procedure Laterality Date  . CESAREAN SECTION  03/13/2011   Procedure: CESAREAN SECTION;  Surgeon: Shon Millet II;  Location: [facility];  Service: Gynecology;  Laterality: N/A;  . CHOLECYSTECTOMY N/A 10/17/2015   Procedure: LAPAROSCOPIC CHOLECYSTECTOMY;  Surgeon: Arta Bruce Kinsinger, MD;  Location: [facility];  Service: General;  Laterality: N/A;    Social History   Socioeconomic History  . Marital status: Single    Spouse name: Not on file  . Number of children: Not on file  . Years of education: Not on file  . Highest education level: Not on file  Social Needs  . Financial resource strain: Not on file  . Food insecurity - worry: Not on file  . Food insecurity - inability: Not on file  . Transportation needs - medical: Not on file  . Transportation needs - non-medical: Not on file  Occupational History  . Not on file  Tobacco Use  . Smoking status: Never Smoker  . Smokeless tobacco: Never Used  Substance and Sexual Activity  . Alcohol use: No  . Drug use: No  . Sexual activity: Yes  Other Topics Concern  . 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       Allergies as of 06/23/2017        Reactions   Hydrocodone-homatropine    vomitting      Medication List        Accurate as of 06/23/17 11:59 PM. Always use your most recent med list.          azelastine 0.1 % nasal spray Commonly known as:  ASTELIN Place 2 sprays into both nostrils at bedtime as needed for rhinitis. Use in each nostril as directed   benzonatate 200 MG capsule Commonly known as:  TESSALON Take 1 capsule (200 mg total) by mouth 3 (three) times daily as needed for cough.   ibuprofen 800 MG tablet Commonly known as:  ADVIL,MOTRIN Take 1 tablet (800 mg total) by mouth every 8 (eight) hours as needed. Always take it with food because may cause gastritis and ulcers.  If you notice nausea, stomach pain, change in the color of stools --->  Stop the medicine and let us know   oseltamivir 75 MG capsule Commonly known as:  TAMIFLU Take 1 capsule (75 mg total) by mouth 2 (two) times daily.   [place] 28 0.25-35 MG-MCG tablet Generic drug:  norgestimate-ethinyl estradiol TAKE 1 TABLET BY MOUTH EVERY DAY. NEED OFFICE VISIT FOR REFILLS          Objective:   Physical Exam BP 126/74 (BP Location: Left Arm, Patient Position: Sitting,  Cuff Size: Normal)   Pulse 99   Temp 99.3 F (37.4 C) (Oral)   Resp 14   Ht 6' (1.829 m)   Wt 251 lb 6 oz (114 kg)   SpO2 99%   BMI 34.09 kg/m  General:   Well developed, well nourished . NAD.  HEENT:  Normocephalic . Face symmetric, atraumatic TMs: Slightly bulged but not red.  No discharge. Throat symmetric, no red. Nose congested, sinuses no TTP Neck: Full range of motion Lungs:  very few rhonchi otherwise clear Normal respiratory effort, no intercostal retractions, no accessory muscle use. Heart: RRR,  no murmur.  No pretibial edema bilaterally  Skin: Not pale. Not jaundice Neurologic:  alert & oriented X3.  Speech normal, gait appropriate for age and unassisted Psych--  Cognition and judgment appear intact.  Cooperative with normal attention  span and concentration.  Behavior appropriate. No anxious or depressed appearing.      Assessment & Plan:   Influenza A: Symptoms consistent with a viral syndrome, influenza A test positive. She did report a severe headache, most likely a part of the  influenza syndrome. Recommend supportive treatment including Tylenol, ibuprofen, fluids, Mucinex and Tessalon Perles.  Also Tamiflu. GI precautions from for ibuprofen discussed. Information regards influenza provided, knows that she could get worse and needs to seek medical help immediately.

## 2017-06-23 NOTE — Patient Instructions (Addendum)
Rest, fluids , tylenol, motrin as needed  For cough:  Take Mucinex DM twice a day as needed until better If the cough continue, take tessalon  3 times a day   For nasal congestion: Use ASTELIN a prescribed spray : 2 nasal sprays on each side of the nose at night until you feel better Get pseudoephedrine 30 mg (behind the counter, you need to talk with the pharmacist) take one tablet 3 or 4 times a day as needed for congestion  Take TAMIFLU  Call if not gradually better over the next  10 days  Call anytime if the symptoms are severe   Influenza, Adult Influenza, more commonly known as "the flu," is a viral infection that primarily affects the respiratory tract. The respiratory tract includes organs that help you breathe, such as the lungs, nose, and throat. The flu causes many common cold symptoms, as well as a high fever and body aches. The flu spreads easily from person to person (is contagious). Getting a flu shot (influenza vaccination) every year is the best way to prevent influenza. What are the causes? Influenza is caused by a virus. You can catch the virus by:  Breathing in droplets from an infected person's cough or sneeze.  Touching something that was recently contaminated with the virus and then touching your mouth, nose, or eyes.  What increases the risk? The following factors may make you more likely to get the flu:  Not cleaning your hands frequently with soap and water or alcohol-based hand sanitizer.  Having close contact with many people during cold and flu season.  Touching your mouth, eyes, or nose without washing or sanitizing your hands first.  Not drinking enough fluids or not eating a healthy diet.  Not getting enough sleep or exercise.  Being under a high amount of stress.  Not getting a yearly (annual) flu shot.  You may be at a higher risk of complications from the flu, such as a severe lung infection (pneumonia), if you:  Are over the age of  92.  Are pregnant.  Have a weakened disease-fighting system (immune system). You may have a weakened immune system if you: ? Have HIV or AIDS. ? Are undergoing chemotherapy. ? Aretaking medicines that reduce the activity of (suppress) the immune system.  Have a long-term (chronic) illness, such as heart disease, kidney disease, diabetes, or lung disease.  Have a liver disorder.  Are obese.  Have anemia.  What are the signs or symptoms? Symptoms of this condition typically last 4-10 days and may include:  Fever.  Chills.  Headache, body aches, or muscle aches.  Sore throat.  Cough.  Runny or congested nose.  Chest discomfort and cough.  Poor appetite.  Weakness or tiredness (fatigue).  Dizziness.  Nausea or vomiting.  How is this diagnosed? This condition may be diagnosed based on your medical history and a physical exam. Your health care provider may do a nose or throat swab test to confirm the diagnosis. How is this treated? If influenza is detected early, you can be treated with antiviral medicine that can reduce the length of your illness and the severity of your symptoms. This medicine may be given by mouth (orally) or through an IV tube that is inserted in one of your veins. The goal of treatment is to relieve symptoms by taking care of yourself at home. This may include taking over-the-counter medicines, drinking plenty of fluids, and adding humidity to the air in your home. In some  cases, influenza goes away on its own. Severe influenza or complications from influenza may be treated in a hospital. Follow these instructions at home:  Take over-the-counter and prescription medicines only as told by your health care provider.  Use a cool mist humidifier to add humidity to the air in your home. This can make breathing easier.  Rest as needed.  Drink enough fluid to keep your urine clear or pale yellow.  Cover your mouth and nose when you cough or  sneeze.  Wash your hands with soap and water often, especially after you cough or sneeze. If soap and water are not available, use hand sanitizer.  Stay home from work or school as told by your health care provider. Unless you are visiting your health care provider, try to avoid leaving home until your fever has been gone for 24 hours without the use of medicine.  Keep all follow-up visits as told by your health care provider. This is important. How is this prevented?  Getting an annual flu shot is the best way to avoid getting the flu. You may get the flu shot in late summer, fall, or winter. Ask your health care provider when you should get your flu shot.  Wash your hands often or use hand sanitizer often.  Avoid contact with people who are sick during cold and flu season.  Eat a healthy diet, drink plenty of fluids, get enough sleep, and exercise regularly. Contact a health care provider if:  You develop new symptoms.  You have: ? Chest pain. ? Diarrhea. ? A fever.  Your cough gets worse.  You produce more mucus.  You feel nauseous or you vomit. Get help right away if:  You develop shortness of breath or difficulty breathing.  Your skin or nails turn a bluish color.  You have severe pain or stiffness in your neck.  You develop a sudden headache or sudden pain in your face or ear.  You cannot stop vomiting. This information is not intended to replace advice given to you by your health care provider. Make sure you discuss any questions you have with your health care provider. Document Released: 04/11/2000 Document Revised: 09/20/2015 Document Reviewed: 02/06/2015 Elsevier Interactive Patient Education  2017 [employer].

## 2017-06-23 NOTE — Progress Notes (Signed)
Pre visit review using our clinic review tool, if applicable. No additional management support is needed unless otherwise documented below in the visit note. 

## 2017-06-25 ENCOUNTER — Encounter: Payer: Self-pay | Admitting: Internal Medicine

## 2017-06-25 ENCOUNTER — Other Ambulatory Visit: Payer: Self-pay | Admitting: Internal Medicine

## 2017-07-09 ENCOUNTER — Encounter: Payer: Self-pay | Admitting: Internal Medicine

## 2017-07-09 ENCOUNTER — Ambulatory Visit: Payer: BC Managed Care – PPO | Admitting: Internal Medicine

## 2017-07-09 VITALS — BP 124/68 | HR 94 | Temp 97.8°F | Resp 14 | Ht 72.0 in | Wt 250.2 lb

## 2017-07-09 DIAGNOSIS — J019 Acute sinusitis, unspecified: Secondary | ICD-10-CM

## 2017-07-09 MED ORDER — FLUCONAZOLE 150 MG PO TABS: 150.0000 mg | ORAL_TABLET | Freq: Every day | ORAL | 0 refills | Status: DC

## 2017-07-09 MED ORDER — PREDNISONE 10 MG PO TABS: ORAL_TABLET | ORAL | 0 refills | Status: DC

## 2017-07-09 MED ORDER — AMOXICILLIN-POT CLAVULANATE 875-125 MG PO TABS: 1.0000 | ORAL_TABLET | Freq: Two times a day (BID) | ORAL | 0 refills | Status: DC

## 2017-07-09 MED ORDER — NORGESTIMATE-ETH ESTRADIOL 0.25-35 MG-MCG PO TABS: 1.0000 | ORAL_TABLET | Freq: Every day | ORAL | 11 refills | Status: DC

## 2017-07-09 NOTE — Patient Instructions (Addendum)
Continue Flonase  Continue the other nose spray (Astelin)  Take prednisone for a few days as prescribed  Take the antibiotic Augmentin for 1 week  Call if not gradually better  Do not use your contact lenses for few days, contact your eye doctor regards your symptoms  While on antibiotics, your birth control may not be as effective, use additional protection  Take Diflucan if needed

## 2017-07-09 NOTE — Progress Notes (Signed)
Subjective:    Patient ID: Natalie Kane, female    DOB: 1981-04-16, 37 y.o.   MRN: [id number]  DOS:  07/09/2017 Type of visit - description : acute Interval history: Was seen with influenza approximately 3 weeks ago, had Tamiflu, overall feels better but not 100%. The reason she is here is d/t ongoing nasal congestion and pressure. 3 days ago she also developed bilateral eye irritation, "like sand-paper in my eyes" along with mild nausea.  Review of Systems No fever chills No vomiting or diarrhea  No actual global headache, all the pain is concentrated around the sinus area. No further cough  Past Medical History:  Diagnosis Date  . Chicken pox   . Fibroids    microscopic  . Headache(784.0)   . Hypertriglyceridemia 12/05/2015  . Pregnancy induced hypertension     Past Surgical History:  Procedure Laterality Date  . CESAREAN SECTION  03/13/2011   Procedure: CESAREAN SECTION;  Surgeon: Shon Millet II;  Location: [facility];  Service: Gynecology;  Laterality: N/A;  . CHOLECYSTECTOMY N/A 10/17/2015   Procedure: LAPAROSCOPIC CHOLECYSTECTOMY;  Surgeon: Arta Bruce Kinsinger, MD;  Location: [facility];  Service: General;  Laterality: N/A;    Social History   Socioeconomic History  . Marital status: Single    Spouse name: Not on file  . Number of children: Not on file  . Years of education: Not on file  . Highest education level: Not on file  Social Needs  . Financial resource strain: Not on file  . Food insecurity - worry: Not on file  . Food insecurity - inability: Not on file  . Transportation needs - medical: Not on file  . Transportation needs - non-medical: Not on file  Occupational History  . Not on file  Tobacco Use  . Smoking status: Never Smoker  . Smokeless tobacco: Never Used  Substance and Sexual Activity  . Alcohol use: No  . Drug use: No  . Sexual activity: Yes  Other Topics Concern  . 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       Allergies as of 07/09/2017      Reactions   Hydrocodone-homatropine    vomitting      Medication List        Accurate as of 07/09/17 11:59 PM. Always use your most recent med list.          amoxicillin-clavulanate 875-125 MG tablet Commonly known as:  AUGMENTIN Take 1 tablet by mouth 2 (two) times daily.   azelastine 0.1 % nasal spray Commonly known as:  ASTELIN Place 2 sprays into both nostrils at bedtime as needed for rhinitis. Use in each nostril as directed   fluconazole 150 MG tablet Commonly known as:  DIFLUCAN Take 1 tablet (150 mg total) by mouth daily.   ibuprofen 800 MG tablet Commonly known as:  ADVIL,MOTRIN Take 1 tablet (800 mg total) by mouth every 8 (eight) hours as needed. Always take it with food because may cause gastritis and ulcers.  If you notice nausea, stomach pain, change in the color of stools --->  Stop the medicine and let us know   norgestimate-ethinyl estradiol 0.25-35 MG-MCG tablet Commonly known as:  SPRINTEC 28 Take 1 tablet by mouth daily.   predniSONE 10 MG tablet Commonly known as:  DELTASONE 3 tabs x 2 days, 2 tabs x 2  days, 1 tab x 2 days          Objective:   Physical Exam  Eyes:     BP 124/68 (BP Location: Left Arm, Patient Position: Sitting, Cuff Size: Normal)   Pulse 94   Temp 97.8 F (36.6 C) (Oral)   Resp 14   Ht 6' (1.829 m)   Wt 250 lb 4 oz (113.5 kg)   SpO2 94%   BMI 33.94 kg/m  General:   Well developed, well nourished . NAD.  HEENT:  Normocephalic . Face symmetric, atraumatic. Throat symmetric no red.  TMs slightly bulged but not red. Sinuses TTP throughout. Nose quite congested EOMI, pupils equal and reactive, left conjunctiva slightly irritated.  She wears contact lenses.  Lungs:  CTA B Normal respiratory effort, no intercostal retractions, no accessory muscle use. Heart: RRR,  no murmur.  No pretibial edema  bilaterally  Skin: Not pale. Not jaundice Neurologic:  alert & oriented X3.  Speech normal, gait appropriate for age and unassisted Psych--  Cognition and judgment appear intact.  Cooperative with normal attention span and concentration.  Behavior appropriate. No anxious or depressed appearing.      Assessment & Plan:   37 year old female, healthy, on birth control pills, presents with:   Acute maxillary and sinus sinusitis: Symptoms consistent with above, recommend to continue Astelin, Flonase. Start Augmentin.  Request a prescription for Diflucan as she typically gets a fungal infection after antibiotics.  Prescription sent. Eye discomfort: See physical exam, doubt hypopyon but recommend to stop use of contact lenses and discussed sx w/ her eye care provider. Request a RF BCPs.  Will do

## 2017-07-09 NOTE — Progress Notes (Signed)
Pre visit review using our clinic review tool, if applicable. No additional management support is needed unless otherwise documented below in the visit note. 

## 2017-07-16 ENCOUNTER — Encounter: Payer: Self-pay | Admitting: Internal Medicine

## 2017-07-16 ENCOUNTER — Telehealth: Payer: Self-pay | Admitting: Family

## 2017-07-16 ENCOUNTER — Other Ambulatory Visit: Payer: Self-pay | Admitting: Internal Medicine

## 2017-07-16 MED ORDER — METRONIDAZOLE 0.75 % VA GEL: 1.0000 | Freq: Every day | VAGINAL | 0 refills | Status: DC

## 2017-07-16 NOTE — Telephone Encounter (Signed)
Copied from [place]. Topic: Quick Communication - Rx Refill/Question >> Jul 16, 2017  1:15 PM Margot Ables wrote: Medication: see pts mychart msg - pt states she is really wanting something sent in today as she is uncomfortable. Advised her Dr. Larose Kells was only here 1/2 day and Melissa off. Can anyone cover? She has 1 dose of med for yeast inf but nothing for BV. Please advise.  Preferred Pharmacy (with phone number or street name): [id number] - [place], [place] - [place] [phone] (Phone) [phone] (Fax)

## 2017-07-16 NOTE — Telephone Encounter (Signed)
Please advise 

## 2017-07-16 NOTE — Telephone Encounter (Signed)
See message.

## 2017-07-17 ENCOUNTER — Other Ambulatory Visit: Payer: Self-pay | Admitting: Internal Medicine

## 2017-07-17 MED ORDER — METRONIDAZOLE 500 MG PO TABS: 500.0000 mg | ORAL_TABLET | Freq: Two times a day (BID) | ORAL | 0 refills | Status: DC

## 2017-09-07 ENCOUNTER — Other Ambulatory Visit: Payer: Self-pay | Admitting: Internal Medicine

## 2017-10-21 ENCOUNTER — Ambulatory Visit: Payer: BC Managed Care – PPO | Admitting: Family

## 2018-01-21 ENCOUNTER — Telehealth: Payer: BC Managed Care – PPO | Admitting: Physician Assistant

## 2018-01-21 DIAGNOSIS — R6889 Other general symptoms and signs: Secondary | ICD-10-CM

## 2018-01-21 MED ORDER — OSELTAMIVIR PHOSPHATE 75 MG PO CAPS: 75.0000 mg | ORAL_CAPSULE | Freq: Two times a day (BID) | ORAL | 0 refills | Status: DC

## 2018-01-21 NOTE — Progress Notes (Signed)

## 2018-02-17 ENCOUNTER — Telehealth: Payer: BC Managed Care – PPO | Admitting: Nurse Practitioner

## 2018-02-17 DIAGNOSIS — R112 Nausea with vomiting, unspecified: Secondary | ICD-10-CM

## 2018-02-17 MED ORDER — ONDANSETRON HCL 4 MG PO TABS: 4.0000 mg | ORAL_TABLET | Freq: Three times a day (TID) | ORAL | 0 refills | Status: DC | PRN

## 2018-02-17 NOTE — Progress Notes (Signed)

## 2018-03-15 ENCOUNTER — Telehealth: Payer: BC Managed Care – PPO | Admitting: Family

## 2018-03-15 DIAGNOSIS — N898 Other specified noninflammatory disorders of vagina: Secondary | ICD-10-CM

## 2018-03-15 MED ORDER — FLUCONAZOLE 150 MG PO TABS: 150.0000 mg | ORAL_TABLET | Freq: Once | ORAL | 1 refills | Status: AC

## 2018-03-15 NOTE — Progress Notes (Signed)
We are sorry that you are not feeling well. Here is how we plan to help! Based on what you shared with me it looks like you: May have a yeast vaginosis  Vaginosis is an inflammation of the vagina that can result in discharge, itching and pain. The cause is usually a change in the normal balance of vaginal bacteria or an infection. Vaginosis can also result from reduced estrogen levels after menopause.  The most common causes of vaginosis are:   Bacterial vaginosis which results from an overgrowth of one on several organisms that are normally present in your vagina.   Yeast infections which are caused by a naturally occurring fungus called candida.   Vaginal atrophy (atrophic vaginosis) which results from the thinning of the vagina from reduced estrogen levels after menopause.   Trichomoniasis which is caused by a parasite and is commonly transmitted by sexual intercourse.  Factors that increase your risk of developing vaginosis include: Marland Kitchen Medications, such as antibiotics and steroids . Uncontrolled diabetes . Use of hygiene products such as bubble bath, vaginal spray or vaginal deodorant . Douching . Wearing damp or tight-fitting clothing . Using an intrauterine device (IUD) for birth control . Hormonal changes, such as those associated with pregnancy, birth control pills or menopause . Sexual activity . Having a sexually transmitted infection  Your treatment plan is A single Diflucan (fluconazole) 150mg  tablet once.  I have electronically sent this prescription into the pharmacy that you have chosen. I added 1 refill if necessary  Be sure to take all of the medication as directed. Stop taking any medication if you develop a rash, tongue swelling or shortness of breath. Mothers who are breast feeding should consider pumping and discarding their breast milk while on these antibiotics. However, there is no consensus that infant exposure at these doses would be harmful.  Remember that  medication creams can weaken latex condoms. Marland Kitchen   HOME CARE:  Good hygiene may prevent some types of vaginosis from recurring and may relieve some symptoms:  . Avoid baths, hot tubs and whirlpool spas. Rinse soap from your outer genital area after a shower, and dry the area well to prevent irritation. Don't use scented or harsh soaps, such as those with deodorant or antibacterial action. Marland Kitchen Avoid irritants. These include scented tampons and pads. . Wipe from front to back after using the toilet. Doing so avoids spreading fecal bacteria to your vagina.  Other things that may help prevent vaginosis include:  Marland Kitchen Don't douche. Your vagina doesn't require cleansing other than normal bathing. Repetitive douching disrupts the normal organisms that reside in the vagina and can actually increase your risk of vaginal infection. Douching won't clear up a vaginal infection. . Use a latex condom. Both female and female latex condoms may help you avoid infections spread by sexual contact. . Wear cotton underwear. Also wear pantyhose with a cotton crotch. If you feel comfortable without it, skip wearing underwear to bed. Yeast thrives in [employer] Your symptoms should improve in the next day or two.  GET HELP RIGHT AWAY IF:  . You have pain in your lower abdomen ( pelvic area or over your ovaries) . You develop nausea or vomiting . You develop a fever . Your discharge changes or worsens . You have persistent pain with intercourse . You develop shortness of breath, a rapid pulse, or you faint.  These symptoms could be signs of problems or infections that need to be evaluated by a medical provider now.  MAKE SURE YOU    Understand these instructions.  Will watch your condition.  Will get help right away if you are not doing well or get worse.  Your e-visit answers were reviewed by a board certified advanced clinical practitioner to complete your personal care plan. Depending upon the  condition, your plan could have included both over the counter or prescription medications. Please review your pharmacy choice to make sure that you have choses a pharmacy that is open for you to pick up any needed prescription, Your safety is important to [place]. If you have drug allergies check your prescription carefully.   You can use MyChart to ask questions about today's visit, request a non-urgent call back, or ask for a work or school excuse for 24 hours related to this e-Visit. If it has been greater than 24 hours you will need to follow up with your provider, or enter a new e-Visit to address those concerns. You will get a MyChart message within the next two days asking about your experience. I hope that your e-visit has been valuable and will speed your recovery.

## 2018-03-16 ENCOUNTER — Telehealth: Payer: Self-pay | Admitting: *Deleted

## 2018-03-16 NOTE — Telephone Encounter (Signed)
Spoke with pt. She states she worked yesterday but did not get to pick up medication until last night and symptoms had not improved so she did not work today. She will try to print letter from [place]. If she is unable to print letter from home she will let us know and will pick up copy from the office.

## 2018-03-16 NOTE — Telephone Encounter (Signed)
Copied from [place] [phone]. Topic: General - Inquiry >> Mar 16, 2018  3:48 PM Reyne Dumas L wrote: Reason for CRM:   See e-visit from yesterday.  Pt is calling to request a work note.

## 2018-03-16 NOTE — Telephone Encounter (Signed)
Ok to provide excuse note for yesterday please.

## 2018-04-02 ENCOUNTER — Encounter: Payer: Self-pay | Admitting: Internal Medicine

## 2018-04-02 ENCOUNTER — Ambulatory Visit (HOSPITAL_BASED_OUTPATIENT_CLINIC_OR_DEPARTMENT_OTHER)
Admission: RE | Admit: 2018-04-02 | Discharge: 2018-04-02 | Disposition: A | Discharge status: Home / Self Care | Payer: BC Managed Care – PPO | Source: Ambulatory Visit | Attending: Internal Medicine | Admitting: Internal Medicine

## 2018-04-02 ENCOUNTER — Ambulatory Visit: Payer: BC Managed Care – PPO | Admitting: Internal Medicine

## 2018-04-02 VITALS — BP 126/70 | HR 79 | Temp 98.1°F | Resp 16 | Ht 72.0 in | Wt 254.4 lb

## 2018-04-02 DIAGNOSIS — G4453 Primary thunderclap headache: Secondary | ICD-10-CM | POA: Insufficient documentation

## 2018-04-02 DIAGNOSIS — G43819 Other migraine, intractable, without status migrainosus: Secondary | ICD-10-CM

## 2018-04-02 LAB — POCT URINE PREGNANCY: Preg Test, Ur: NEGATIVE

## 2018-04-02 MED ORDER — INDOMETHACIN 25 MG PO CAPS: 25.0000 mg | ORAL_CAPSULE | Freq: Three times a day (TID) | ORAL | 0 refills | Status: DC | PRN

## 2018-04-02 MED ORDER — ONDANSETRON HCL 4 MG PO TABS: 4.0000 mg | ORAL_TABLET | Freq: Three times a day (TID) | ORAL | 0 refills | Status: DC | PRN

## 2018-04-02 MED ORDER — PREDNISONE 10 MG PO TABS: ORAL_TABLET | ORAL | 0 refills | Status: DC

## 2018-04-02 MED ORDER — SUMATRIPTAN SUCCINATE 50 MG PO TABS: 100.0000 mg | ORAL_TABLET | ORAL | 0 refills | Status: DC | PRN

## 2018-04-02 NOTE — Patient Instructions (Signed)
Proceed with the CT of the head now.  If it is normal will do the following:  Rest  Drink plenty fluids Zofran as needed for nausea Take sumatriptan 1 tablet and repeat in 2 hours Indocin as needed. Start prednisone  Come back and see your primary doctor in 2 weeks  Go to the ER if you are not gradually better or if you have a severe headache.

## 2018-04-02 NOTE — Progress Notes (Signed)
Pre visit review using our clinic review tool, if applicable. No additional management support is needed unless otherwise documented below in the visit note. 

## 2018-04-02 NOTE — Progress Notes (Signed)
Subjective:    Patient ID: Natalie Kane, female    DOB: 02/19/1981, 37 y.o.   MRN: [id number]  DOS:  04/02/2018 Type of visit - description : Acute visit Headache started 2 days ago, acute & sudden onset. Located behind the eyes bilaterally. Associated with nausea, vomiting, she is sensitive to light but not to sounds. It is the worse headache she had over the last year. She is having frequent headaches, approximately once a month.  Today for the first time the vomiting has stopped, headache is slightly decreased but is still there. She continue with nausea.  Review of Systems No fever chills No recent head injury No sinus pain or congestion. Some stress at work but nothing new to her.  Past Medical History:  Diagnosis Date  . Chicken pox   . Fibroids    microscopic  . Headache(784.0)   . Hypertriglyceridemia 12/05/2015  . Pregnancy induced hypertension     Past Surgical History:  Procedure Laterality Date  . CESAREAN SECTION  03/13/2011   Procedure: CESAREAN SECTION;  Surgeon: Shon Millet II;  Location: [facility];  Service: Gynecology;  Laterality: N/A;  . CHOLECYSTECTOMY N/A 10/17/2015   Procedure: LAPAROSCOPIC CHOLECYSTECTOMY;  Surgeon: Arta Bruce Kinsinger, MD;  Location: [facility];  Service: General;  Laterality: N/A;    Social History   Socioeconomic History  . Marital status: Single    Spouse name: Not on file  . Number of children: Not on file  . Years of education: Not on file  . Highest education level: Not on file  Occupational History  . Not on file  Social Needs  . Financial resource strain: Not on file  . Food insecurity:    Worry: Not on file    Inability: Not on file  . Transportation needs:    Medical: Not on file    Non-medical: Not on file  Tobacco Use  . Smoking status: Never Smoker  . Smokeless tobacco: Never Used  Substance and Sexual Activity  . Alcohol use: No  . Drug use: No  . Sexual activity: Not Currently  Lifestyle  .  Physical activity:    Days per week: Not on file    Minutes per session: Not on file  . Stress: Not on file  Relationships  . Social connections:    Talks on phone: Not on file    Gets together: Not on file    Attends religious service: Not on file    Active member of club or organization: Not on file    Attends meetings of clubs or organizations: Not on file    Relationship status: Not on file  . Intimate partner violence:    Fear of current or ex partner: Not on file    Emotionally abused: Not on file    Physically abused: Not on file    Forced sexual activity: Not on file  Other Topics Concern  . 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       Allergies as of 04/02/2018      Reactions   Hydrocodone-homatropine    vomitting      Medication List        Accurate as of 04/02/18 11:59 PM. Always use your most recent med list.          indomethacin 25 MG capsule Commonly known as:  INDOCIN  Take 1 capsule (25 mg total) by mouth 3 (three) times daily as needed.   ondansetron 4 MG tablet Commonly known as:  ZOFRAN Take 1 tablet (4 mg total) by mouth every 8 (eight) hours as needed for nausea or vomiting.   predniSONE 10 MG tablet Commonly known as:  DELTASONE 4 tablets x 2 days, 3 tabs x 2 days, 2 tabs x 2 days, 1 tab x 2 days   SUMAtriptan 50 MG tablet Commonly known as:  IMITREX Take 2 tablets (100 mg total) by mouth every 2 (two) hours as needed for migraine (NO MORE THAN 4 TABLETS IN A 24 HOUR PERIOD). May repeat in 2 hours if headache persists or recurs.           Objective:   Physical Exam BP 126/70 (BP Location: Left Arm, Patient Position: Sitting, Cuff Size: Normal)   Pulse 79   Temp 98.1 F (36.7 C) (Oral)   Resp 16   Ht 6' (1.829 m)   Wt 254 lb 6 oz (115.4 kg)   LMP 03/23/2018 (Exact Date)   SpO2 98%   BMI 34.50 kg/m  General:   Well  developed, in no distress but she looks slightly uncomfortable. HEENT:  Normocephalic . Face symmetric, atraumatic Neck: Full range of motion Lungs:  CTA B Normal respiratory effort, no intercostal retractions, no accessory muscle use. Heart: RRR,  no murmur.  No pretibial edema bilaterally  Skin: Not pale. Not jaundice Neurologic:  alert & oriented X3.  Speech normal, gait appropriate for age and unassisted. EOMI.  Motor symmetric.  DTR symmetric except for slightly decreased right knee jerk. Psych--  Cognition and judgment appear intact.  Cooperative with normal attention span and concentration.  Behavior appropriate. No anxious or depressed appearing.      Assessment & Plan:   Headache Symptoms of started 2 days ago, it has some classic features of migraines but it started as a "thunderclap" which make  somewhat concern. Plan: CT head, if negative proceed with acute treatment. Acute treatment: Rest, fluids, sumatriptan, indometacin and prednisone. For long-term treatment, she will likely benefit from Topamax as she is having headaches monthly, recommend to see PCP in 2 weeks. Addendum: CT  Neg, pt aware

## 2018-05-14 ENCOUNTER — Ambulatory Visit: Payer: BC Managed Care – PPO | Admitting: Family

## 2018-05-26 ENCOUNTER — Telehealth: Payer: Self-pay | Admitting: *Deleted

## 2018-05-26 NOTE — Telephone Encounter (Signed)
Received request for Medical Records from [facility]; forwarded to Medical Records via email/scan/SLS 01/29

## 2018-06-14 ENCOUNTER — Encounter: Payer: Self-pay | Admitting: Physician Assistant

## 2018-06-14 ENCOUNTER — Telehealth: Payer: BC Managed Care – PPO | Admitting: Physician Assistant

## 2018-06-14 DIAGNOSIS — B379 Candidiasis, unspecified: Secondary | ICD-10-CM | POA: Diagnosis not present

## 2018-06-14 MED ORDER — FLUCONAZOLE 150 MG PO TABS: 150.0000 mg | ORAL_TABLET | Freq: Once | ORAL | 0 refills | Status: AC

## 2018-06-14 NOTE — Progress Notes (Signed)
We are sorry that you are not feeling well. Here is how we plan to help! Based on what you shared with me it looks like you: May have a yeast vaginosis  Vaginosis is an inflammation of the vagina that can result in discharge, itching and pain. The cause is usually a change in the normal balance of vaginal bacteria or an infection. Vaginosis can also result from reduced estrogen levels after menopause.  The most common causes of vaginosis are:   Bacterial vaginosis which results from an overgrowth of one on several organisms that are normally present in your vagina.   Yeast infections which are caused by a naturally occurring fungus called candida.   Vaginal atrophy (atrophic vaginosis) which results from the thinning of the vagina from reduced estrogen levels after menopause.   Trichomoniasis which is caused by a parasite and is commonly transmitted by sexual intercourse.  Factors that increase your risk of developing vaginosis include: Marland Kitchen Medications, such as antibiotics and steroids . Uncontrolled diabetes . Use of hygiene products such as bubble bath, vaginal spray or vaginal deodorant . Douching . Wearing damp or tight-fitting clothing . Using an intrauterine device (IUD) for birth control . Hormonal changes, such as those associated with pregnancy, birth control pills or menopause . Sexual activity . Having a sexually transmitted infection  Your treatment plan is A single Diflucan (fluconazole) 150mg  tablet once.  I have electronically sent this prescription into the pharmacy that you have chosen.   According to review of your records, you may have been diagnosed with yeast infection in the past 3 months. Recurrent yeast infections warrant further workup to make sure there are not any underlying illnesses that predispose you to this. Please follow up with your primary care provider for further workup of this.   Be sure to take all of the medication as directed. Stop taking any  medication if you develop a rash, tongue swelling or shortness of breath. Mothers who are breast feeding should consider pumping and discarding their breast milk while on these antibiotics. However, there is no consensus that infant exposure at these doses would be harmful.  Remember that medication creams can weaken latex condoms. Marland Kitchen   HOME CARE:  Good hygiene may prevent some types of vaginosis from recurring and may relieve some symptoms:  . Avoid baths, hot tubs and whirlpool spas. Rinse soap from your outer genital area after a shower, and dry the area well to prevent irritation. Don't use scented or harsh soaps, such as those with deodorant or antibacterial action. Marland Kitchen Avoid irritants. These include scented tampons and pads. . Wipe from front to back after using the toilet. Doing so avoids spreading fecal bacteria to your vagina.  Other things that may help prevent vaginosis include:  Marland Kitchen Don't douche. Your vagina doesn't require cleansing other than normal bathing. Repetitive douching disrupts the normal organisms that reside in the vagina and can actually increase your risk of vaginal infection. Douching won't clear up a vaginal infection. . Use a latex condom. Both female and female latex condoms may help you avoid infections spread by sexual contact. . Wear cotton underwear. Also wear pantyhose with a cotton crotch. If you feel comfortable without it, skip wearing underwear to bed. Yeast thrives in [employer] Your symptoms should improve in the next day or two.  GET HELP RIGHT AWAY IF:  . You have pain in your lower abdomen ( pelvic area or over your ovaries) . You develop nausea or vomiting .  You develop a fever . Your discharge changes or worsens . You have persistent pain with intercourse . You develop shortness of breath, a rapid pulse, or you faint.  These symptoms could be signs of problems or infections that need to be evaluated by a medical provider now.  MAKE SURE  YOU    Understand these instructions.  Will watch your condition.  Will get help right away if you are not doing well or get worse.  Your e-visit answers were reviewed by a board certified advanced clinical practitioner to complete your personal care plan. Depending upon the condition, your plan could have included both over the counter or prescription medications. Please review your pharmacy choice to make sure that you have choses a pharmacy that is open for you to pick up any needed prescription, Your safety is important to [place]. If you have drug allergies check your prescription carefully.   You can use MyChart to ask questions about today's visit, request a non-urgent call back, or ask for a work or school excuse for 24 hours related to this e-Visit. If it has been greater than 24 hours you will need to follow up with your provider, or enter a new e-Visit to address those concerns. You will get a MyChart message within the next two days asking about your experience. I hope that your e-visit has been valuable and will speed your recovery.

## 2018-06-14 NOTE — Addendum Note (Signed)
Addended by: Waldon Merl on: 06/14/2018 10:55 AM   Modules accepted: Orders

## 2018-06-17 LAB — HM PAP SMEAR

## 2018-06-18 ENCOUNTER — Telehealth: Payer: Self-pay | Admitting: *Deleted

## 2018-06-18 NOTE — Telephone Encounter (Signed)
Received request for Medical Records from [facility]; forwarded to Medical Records via email/scan/SLS

## 2018-07-16 ENCOUNTER — Telehealth: Payer: BC Managed Care – PPO | Admitting: Family

## 2018-07-16 DIAGNOSIS — B373 Candidiasis of vulva and vagina: Secondary | ICD-10-CM

## 2018-07-16 DIAGNOSIS — B3731 Acute candidiasis of vulva and vagina: Secondary | ICD-10-CM

## 2018-07-16 MED ORDER — FLUCONAZOLE 150 MG PO TABS: 150.0000 mg | ORAL_TABLET | ORAL | 0 refills | Status: DC

## 2018-07-16 NOTE — Progress Notes (Signed)
We are sorry that you are not feeling well. Here is how we plan to help! Based on what you shared with me it looks like you: May have a yeast vaginosis  Vaginosis is an inflammation of the vagina that can result in discharge, itching and pain. The cause is usually a change in the normal balance of vaginal bacteria or an infection. Vaginosis can also result from reduced estrogen levels after menopause.  The most common causes of vaginosis are:   Bacterial vaginosis which results from an overgrowth of one on several organisms that are normally present in your vagina.   Yeast infections which are caused by a naturally occurring fungus called candida.   Vaginal atrophy (atrophic vaginosis) which results from the thinning of the vagina from reduced estrogen levels after menopause.   Trichomoniasis which is caused by a parasite and is commonly transmitted by sexual intercourse.  Factors that increase your risk of developing vaginosis include: Marland Kitchen Medications, such as antibiotics and steroids . Uncontrolled diabetes . Use of hygiene products such as bubble bath, vaginal spray or vaginal deodorant . Douching . Wearing damp or tight-fitting clothing . Using an intrauterine device (IUD) for birth control . Hormonal changes, such as those associated with pregnancy, birth control pills or menopause . Sexual activity . Having a sexually transmitted infection  Your treatment plan is A single Diflucan (fluconazole) 150mg  tablet once.  I have electronically sent this prescription into the pharmacy that you have chosen. I have sent in 2 tabs  Be sure to take all of the medication as directed. Stop taking any medication if you develop a rash, tongue swelling or shortness of breath. Mothers who are breast feeding should consider pumping and discarding their breast milk while on these antibiotics. However, there is no consensus that infant exposure at these doses would be harmful.  Remember that medication  creams can weaken latex condoms. Marland Kitchen   HOME CARE:  Good hygiene may prevent some types of vaginosis from recurring and may relieve some symptoms:  . Avoid baths, hot tubs and whirlpool spas. Rinse soap from your outer genital area after a shower, and dry the area well to prevent irritation. Don't use scented or harsh soaps, such as those with deodorant or antibacterial action. Marland Kitchen Avoid irritants. These include scented tampons and pads. . Wipe from front to back after using the toilet. Doing so avoids spreading fecal bacteria to your vagina.  Other things that may help prevent vaginosis include:  Marland Kitchen Don't douche. Your vagina doesn't require cleansing other than normal bathing. Repetitive douching disrupts the normal organisms that reside in the vagina and can actually increase your risk of vaginal infection. Douching won't clear up a vaginal infection. . Use a latex condom. Both female and female latex condoms may help you avoid infections spread by sexual contact. . Wear cotton underwear. Also wear pantyhose with a cotton crotch. If you feel comfortable without it, skip wearing underwear to bed. Yeast thrives in [employer] Your symptoms should improve in the next day or two.  GET HELP RIGHT AWAY IF:  . You have pain in your lower abdomen ( pelvic area or over your ovaries) . You develop nausea or vomiting . You develop a fever . Your discharge changes or worsens . You have persistent pain with intercourse . You develop shortness of breath, a rapid pulse, or you faint.  These symptoms could be signs of problems or infections that need to be evaluated by a medical provider now.  MAKE SURE YOU    Understand these instructions.  Will watch your condition.  Will get help right away if you are not doing well or get worse.  Your e-visit answers were reviewed by a board certified advanced clinical practitioner to complete your personal care plan. Depending upon the condition, your  plan could have included both over the counter or prescription medications. Please review your pharmacy choice to make sure that you have choses a pharmacy that is open for you to pick up any needed prescription, Your safety is important to [place]. If you have drug allergies check your prescription carefully.   You can use MyChart to ask questions about today's visit, request a non-urgent call back, or ask for a work or school excuse for 24 hours related to this e-Visit. If it has been greater than 24 hours you will need to follow up with your provider, or enter a new e-Visit to address those concerns. You will get a MyChart message within the next two days asking about your experience. I hope that your e-visit has been valuable and will speed your recovery.

## 2018-08-11 ENCOUNTER — Telehealth: Payer: Self-pay

## 2018-08-11 NOTE — Telephone Encounter (Signed)
Please advise 

## 2018-08-11 NOTE — Telephone Encounter (Signed)
Patient scheduled to see Dr. Larose Kells tomorrow

## 2018-08-11 NOTE — Telephone Encounter (Signed)
Lm for  Patient to call back if she needs virtual visit

## 2018-08-12 ENCOUNTER — Other Ambulatory Visit: Payer: Self-pay

## 2018-08-12 ENCOUNTER — Ambulatory Visit (INDEPENDENT_AMBULATORY_CARE_PROVIDER_SITE_OTHER): Payer: BC Managed Care – PPO | Admitting: Internal Medicine

## 2018-08-12 ENCOUNTER — Ambulatory Visit: Payer: BC Managed Care – PPO | Admitting: Internal Medicine

## 2018-08-12 DIAGNOSIS — S8992XA Unspecified injury of left lower leg, initial encounter: Secondary | ICD-10-CM

## 2018-08-12 NOTE — Progress Notes (Signed)
   Subjective:    Patient ID: Natalie Kane, female    DOB: 05/02/1980, 38 y.o.   MRN: [id number]  DOS:  08/12/2018 Type of visit - description:  Doxy[employer]me message sent, also called.  Unable to contact patient LMOM: If she still needs a visit, let us know, will do it this afternoon

## 2018-08-12 NOTE — Progress Notes (Signed)
Subjective:    Patient ID: Natalie Kane, female    DOB: 1980-05-07, 38 y.o.   MRN: [id number]  DOS:  08/12/2018 Type of visit - description: Virtual Visit via Video Note  I connected with@ on 08/12/18 at  2:20 PM EDT by a video enabled telemedicine application and verified that I am speaking with the correct person using two identifiers.   THIS ENCOUNTER IS A VIRTUAL VISIT DUE TO COVID-19 - PATIENT WAS NOT SEEN IN THE OFFICE. PATIENT HAS CONSENTED TO VIRTUAL VISIT / TELEMEDICINE VISIT   Location of patient: home  Location of provider: office  I discussed the limitations of evaluation and management by telemedicine and the availability of in person appointments. The patient expressed understanding and agreed to proceed.  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 Since then, she has not been able to walk. Not able to work. Has been using ice, hot compresses and ibuprofen 800 mg and some Tylenol. Needs further advise.   Review of Systems Denies fever chills Denies other injuries Knee swelling, unable to fully flex or extend the knee  Past Medical History:  Diagnosis Date  . Chicken pox   . Fibroids    microscopic  . Headache(784.0)   . Hypertriglyceridemia 12/05/2015  . Pregnancy induced hypertension     Past Surgical History:  Procedure Laterality Date  . CESAREAN SECTION  03/13/2011   Procedure: CESAREAN SECTION;  Surgeon: Shon Millet II;  Location: [facility];  Service: Gynecology;  Laterality: N/A;  . CHOLECYSTECTOMY N/A 10/17/2015   Procedure: LAPAROSCOPIC CHOLECYSTECTOMY;  Surgeon: Arta Bruce Kinsinger, MD;  Location: [facility];  Service: General;  Laterality: N/A;    Social History   Socioeconomic History  . Marital status: Single    Spouse name: Not on file  . Number of children: Not on file  . Years of  education: Not on file  . Highest education level: Not on file  Occupational History  . Not on file  Social Needs  . Financial resource strain: Not on file  . Food insecurity:    Worry: Not on file    Inability: Not on file  . Transportation needs:    Medical: Not on file    Non-medical: Not on file  Tobacco Use  . Smoking status: Never Smoker  . Smokeless tobacco: Never Used  Substance and Sexual Activity  . Alcohol use: No  . Drug use: No  . Sexual activity: Not Currently  Lifestyle  . Physical activity:    Days per week: Not on file    Minutes per session: Not on file  . Stress: Not on file  Relationships  . Social connections:    Talks on phone: Not on file    Gets together: Not on file    Attends religious service: Not on file    Active member of club or organization: Not on file    Attends meetings of clubs or organizations: Not on file    Relationship status: Not on file  . Intimate partner violence:    Fear of current or ex partner: Not on file    Emotionally abused: Not on file    Physically abused: Not on file    Forced sexual activity: Not on file  Other Topics Concern  . 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       Allergies as of 08/12/2018      Reactions   Hydrocodone-homatropine    vomitting      Medication List       Accurate as of August 12, 2018  2:04 PM. Always use your most recent med list.        fluconazole 150 MG tablet Commonly known as:  DIFLUCAN Take 1 tablet (150 mg total) by mouth once a week.   indomethacin 25 MG capsule Commonly known as:  INDOCIN Take 1 capsule (25 mg total) by mouth 3 (three) times daily as needed.   ondansetron 4 MG tablet Commonly known as:  ZOFRAN Take 1 tablet (4 mg total) by mouth every 8 (eight) hours as needed for nausea or vomiting.   predniSONE 10 MG tablet Commonly known as:   DELTASONE 4 tablets x 2 days, 3 tabs x 2 days, 2 tabs x 2 days, 1 tab x 2 days   SUMAtriptan 50 MG tablet Commonly known as:  IMITREX Take 2 tablets (100 mg total) by mouth every 2 (two) hours as needed for migraine (NO MORE THAN 4 TABLETS IN A 24 HOUR PERIOD). May repeat in 2 hours if headache persists or recurs.           Objective:   Physical Exam There were no vitals taken for this visit. This is a virtual, video visit. I tried to see the knee but that was very difficult.  She is alert oriented x3 and in no distress.  Assessment    Left knee injury: Happened 3 days ago, the knee is swollen, unable to walk. Needs to see orthopedics ASAP, rule out internal derangement versus others. My nurse will call the patient soon and arrange a visit with Ortho today or tomorrow. In the meantime, recommend to continue ice, continue ibuprofen with GI precautions.     I discussed the assessment and treatment plan with the patient. The patient was provided an opportunity to ask questions and all were answered. The patient agreed with the plan and demonstrated an understanding of the instructions.   The patient was advised to call back or seek an in-person evaluation if the symptoms worsen or if the condition fails to improve as anticipated.

## 2018-09-04 ENCOUNTER — Telehealth: Payer: BC Managed Care – PPO | Admitting: Physician Assistant

## 2018-09-04 DIAGNOSIS — N760 Acute vaginitis: Secondary | ICD-10-CM | POA: Diagnosis not present

## 2018-09-04 MED ORDER — FLUCONAZOLE 150 MG PO TABS: 150.0000 mg | ORAL_TABLET | Freq: Once | ORAL | 0 refills | Status: AC

## 2018-09-04 NOTE — Progress Notes (Signed)
We are sorry that you are not feeling well. Here is how we plan to help! Based on what you shared with me it looks like you: May have a yeast vaginosis   Vaginosis is an inflammation of the vagina that can result in discharge, itching and pain. The cause is usually a change in the normal balance of vaginal bacteria or an infection. Vaginosis can also result from reduced estrogen levels after menopause.  The most common causes of vaginosis are:  Bacterial vaginosis which results from an overgrowth of one on several organisms that are normally present in your vagina.  Yeast infections which are caused by a naturally occurring fungus called candida.  Vaginal atrophy (atrophic vaginosis) which results from the thinning of the vagina from reduced estrogen levels after menopause.  Trichomoniasis which is caused by a parasite and is commonly transmitted by sexual intercourse.  Factors that increase your risk of developing vaginosis include: Medications, such as antibiotics and steroids Uncontrolled diabetes Use of hygiene products such as bubble bath, vaginal spray or vaginal deodorant Douching Wearing damp or tight-fitting clothing Using an intrauterine device (IUD) for birth control Hormonal changes, such as those associated with pregnancy, birth control pills or menopause Sexual activity Having a sexually transmitted infection  Your treatment plan is A single Diflucan (fluconazole) 150mg  tablet once and repeat in 48 to 72 hours if needed. I have electronically sent this prescription into the pharmacy that you have chosen.  Be sure to take all of the medication as directed. Stop taking any medication if you develop a rash, tongue swelling or shortness of breath. Mothers who are breast feeding should consider pumping and discarding their breast milk while on these antibiotics. However, there is no consensus that infant exposure at these doses would be harmful.  Remember  that medication creams can weaken latex condoms. Marland Kitchen   HOME CARE:  Good hygiene may prevent some types of vaginosis from recurring and may relieve some symptoms:  Avoid baths, hot tubs and whirlpool spas. Rinse soap from your outer genital area after a shower, and dry the area well to prevent irritation. Don't use scented or harsh soaps, such as those with deodorant or antibacterial action. Avoid irritants. These include scented tampons and pads. Wipe from front to back after using the toilet. Doing so avoids spreading fecal bacteria to your vagina.  Other things that may help prevent vaginosis include:  Don't douche. Your vagina doesn't require cleansing other than normal bathing. Repetitive douching disrupts the normal organisms that reside in the vagina and can actually increase your risk of vaginal infection. Douching won't clear up a vaginal infection. Use a latex condom. Both female and female latex condoms may help you avoid infections spread by sexual contact. Wear cotton underwear. Also wear pantyhose with a cotton crotch. If you feel comfortable without it, skip wearing underwear to bed. Yeast thrives in [employer] Your symptoms should improve in the next day or two.  GET HELP RIGHT AWAY IF:  You have pain in your lower abdomen ( pelvic area or over your ovaries) You develop nausea or vomiting You develop a fever Your discharge changes or worsens You have persistent pain with intercourse You develop shortness of breath, a rapid pulse, or you faint.  These symptoms could be signs of problems or infections that need to be evaluated by a medical provider now.  MAKE SURE YOU   Understand these instructions. Will watch your condition. Will get help right away if you are not  doing well or get worse.  Your e-visit answers were reviewed by a board certified advanced clinical practitioner to complete your personal care plan. Depending upon the condition, your plan could have  included both over the counter or prescription medications. Please review your pharmacy choice to make sure that you have choses a pharmacy that is open for you to pick up any needed prescription, Your safety is important to [place]. If you have drug allergies check your prescription carefully.   You can use MyChart to ask questions about today's visit, request a non-urgent call back, or ask for a work or school excuse for 24 hours related to this e-Visit. If it has been greater than 24 hours you will need to follow up with your provider, or enter a new e-Visit to address those concerns. You will get a MyChart message within the next two days asking about your experience. I hope that your e-visit has been valuable and will speed your recovery.  A total of 5-10 minutes was spent evaluating this patients questionnaire and formulating a plan of care.

## 2018-10-11 ENCOUNTER — Emergency Department (HOSPITAL_BASED_OUTPATIENT_CLINIC_OR_DEPARTMENT_OTHER)
Admission: EM | Admit: 2018-10-11 | Discharge: 2018-10-11 | Disposition: A | Discharge status: Home / Self Care | Payer: BC Managed Care – PPO | Attending: Emergency Medicine | Admitting: Emergency Medicine

## 2018-10-11 ENCOUNTER — Emergency Department (HOSPITAL_BASED_OUTPATIENT_CLINIC_OR_DEPARTMENT_OTHER): Payer: BC Managed Care – PPO

## 2018-10-11 ENCOUNTER — Other Ambulatory Visit: Payer: Self-pay

## 2018-10-11 ENCOUNTER — Encounter (HOSPITAL_BASED_OUTPATIENT_CLINIC_OR_DEPARTMENT_OTHER): Payer: Self-pay

## 2018-10-11 DIAGNOSIS — M545 Low back pain: Secondary | ICD-10-CM | POA: Diagnosis not present

## 2018-10-11 DIAGNOSIS — M25562 Pain in left knee: Secondary | ICD-10-CM | POA: Insufficient documentation

## 2018-10-11 DIAGNOSIS — M542 Cervicalgia: Secondary | ICD-10-CM | POA: Insufficient documentation

## 2018-10-11 DIAGNOSIS — Y9389 Activity, other specified: Secondary | ICD-10-CM | POA: Insufficient documentation

## 2018-10-11 DIAGNOSIS — Y999 Unspecified external cause status: Secondary | ICD-10-CM | POA: Diagnosis not present

## 2018-10-11 DIAGNOSIS — S060X0A Concussion without loss of consciousness, initial encounter: Secondary | ICD-10-CM | POA: Diagnosis not present

## 2018-10-11 DIAGNOSIS — Y92410 Unspecified street and highway as the place of occurrence of the external cause: Secondary | ICD-10-CM | POA: Diagnosis not present

## 2018-10-11 DIAGNOSIS — S0990XA Unspecified injury of head, initial encounter: Secondary | ICD-10-CM | POA: Diagnosis present

## 2018-10-11 DIAGNOSIS — M7918 Myalgia, other site: Secondary | ICD-10-CM

## 2018-10-11 MED ORDER — CYCLOBENZAPRINE HCL 10 MG PO TABS: 10.0000 mg | ORAL_TABLET | Freq: Two times a day (BID) | ORAL | 0 refills | Status: DC | PRN

## 2018-10-11 MED ORDER — ACETAMINOPHEN 325 MG PO TABS
650.0000 mg | ORAL_TABLET | Freq: Once | ORAL | Status: AC
  Administered 2018-10-11: 650 mg via ORAL
  Filled 2018-10-11: qty 2

## 2018-10-11 MED ORDER — ONDANSETRON 4 MG PO TBDP
4.0000 mg | ORAL_TABLET | Freq: Once | ORAL | Status: AC
  Administered 2018-10-11: 4 mg via ORAL
  Filled 2018-10-11: qty 1
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
## 2018-10-13 ENCOUNTER — Other Ambulatory Visit: Payer: Self-pay

## 2018-10-13 ENCOUNTER — Ambulatory Visit: Payer: BC Managed Care – PPO | Admitting: Family Medicine

## 2018-10-13 ENCOUNTER — Encounter: Payer: Self-pay | Admitting: Family Medicine

## 2018-10-13 VITALS — BP 120/80 | HR 85 | Temp 99.0°F | Ht 72.0 in | Wt 245.0 lb

## 2018-10-13 DIAGNOSIS — S060X0A Concussion without loss of consciousness, initial encounter: Secondary | ICD-10-CM

## 2018-10-13 DIAGNOSIS — M545 Low back pain, unspecified: Secondary | ICD-10-CM

## 2018-10-13 DIAGNOSIS — M542 Cervicalgia: Secondary | ICD-10-CM | POA: Diagnosis not present

## 2018-10-13 MED ORDER — MELOXICAM 15 MG PO TABS: 15.0000 mg | ORAL_TABLET | Freq: Every day | ORAL | 0 refills | Status: DC
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
## 2018-10-13 NOTE — Patient Instructions (Signed)
Heat (pad or rice pillow in microwave) over affected area, 10-15 minutes twice daily.   Ice/cold pack over area for 10-15 min twice daily.  OK to take Tylenol 1000 mg (2 extra strength tabs) or 975 mg (3 regular strength tabs) every 6 hours as needed.  Fish oil 3 grams daily for 10 days then 2 grams daily  Vitamin D 4000 IU daily  CoQ10 200mg  daily for headaches  Tart cherry extract any dose at night  EXERCISES RANGE OF MOTION (ROM) AND STRETCHING EXERCISES  These exercises may help you when beginning to rehabilitate your issue. In order to successfully resolve your symptoms, you must improve your posture. These exercises are designed to help reduce the forward-head and rounded-shoulder posture which contributes to this condition. Your symptoms may resolve with or without further involvement from your physician, physical therapist or athletic trainer. While completing these exercises, remember:   Restoring tissue flexibility helps normal motion to return to the joints. This allows healthier, less painful movement and activity.  An effective stretch should be held for at least 20 seconds, although you may need to begin with shorter hold times for comfort.  A stretch should never be painful. You should only feel a gentle lengthening or release in the stretched tissue.  Do not do any stretch or exercise that you cannot tolerate.  STRETCH- Axial Extensors  Lie on your back on the floor. You may bend your knees for comfort. Place a rolled-up hand towel or dish towel, about 2 inches in diameter, under the part of your head that makes contact with the floor.  Gently tuck your chin, as if trying to make a "double chin," until you feel a gentle stretch at the base of your head.  Hold 15-20 seconds. Repeat 2-3 times. Complete this exercise 1 time per day.   STRETCH - Axial Extension   Stand or sit on a firm surface. Assume a good posture: chest up, shoulders drawn back, abdominal muscles  slightly tense, knees unlocked (if standing) and feet hip width apart.  Slowly retract your chin so your head slides back and your chin slightly lowers. Continue to look straight ahead.  You should feel a gentle stretch in the back of your head. Be certain not to feel an aggressive stretch since this can cause headaches later.  Hold for 15-20 seconds. Repeat 2-3 times. Complete this exercise 1 time per day.  STRETCH - Cervical Side Bend   Stand or sit on a firm surface. Assume a good posture: chest up, shoulders drawn back, abdominal muscles slightly tense, knees unlocked (if standing) and feet hip width apart.  Without letting your nose or shoulders move, slowly tip your right / left ear to your shoulder until your feel a gentle stretch in the muscles on the opposite side of your neck.  Hold 15-20 seconds. Repeat 2-3 times. Complete this exercise 1-2 times per day.  STRETCH - Cervical Rotators   Stand or sit on a firm surface. Assume a good posture: chest up, shoulders drawn back, abdominal muscles slightly tense, knees unlocked (if standing) and feet hip width apart.  Keeping your eyes level with the ground, slowly turn your head until you feel a gentle stretch along the back and opposite side of your neck.  Hold 15-20 seconds. Repeat 2-3 times. Complete this exercise 1-2 times per day.  RANGE OF MOTION - Neck Circles   Stand or sit on a firm surface. Assume a good posture: chest up, shoulders drawn back,  abdominal muscles slightly tense, knees unlocked (if standing) and feet hip width apart.  Gently roll your head down and around from the back of one shoulder to the back of the other. The motion should never be forced or painful.  Repeat the motion 10-20 times, or until you feel the neck muscles relax and loosen. Repeat 2-3 times. Complete the exercise 1-2 times per day. STRENGTHENING EXERCISES - Cervical Strain and Sprain These exercises may help you when beginning to  rehabilitate your injury. They may resolve your symptoms with or without further involvement from your physician, physical therapist, or athletic trainer. While completing these exercises, remember:   Muscles can gain both the endurance and the strength needed for everyday activities through controlled exercises.  Complete these exercises as instructed by your physician, physical therapist, or athletic trainer. Progress the resistance and repetitions only as guided.  You may experience muscle soreness or fatigue, but the pain or discomfort you are trying to eliminate should never worsen during these exercises. If this pain does worsen, stop and make certain you are following the directions exactly. If the pain is still present after adjustments, discontinue the exercise until you can discuss the trouble with your clinician.  STRENGTH - Cervical Flexors, Isometric  Face a wall, standing about 6 inches away. Place a small pillow, a ball about 6-8 inches in diameter, or a folded towel between your forehead and the wall.  Slightly tuck your chin and gently push your forehead into the soft object. Push only with mild to moderate intensity, building up tension gradually. Keep your jaw and forehead relaxed.  Hold 10 to 20 seconds. Keep your breathing relaxed.  Release the tension slowly. Relax your neck muscles completely before you start the next repetition. Repeat 2-3 times. Complete this exercise 1 time per day.  STRENGTH- Cervical Lateral Flexors, Isometric   Stand about 6 inches away from a wall. Place a small pillow, a ball about 6-8 inches in diameter, or a folded towel between the side of your head and the wall.  Slightly tuck your chin and gently tilt your head into the soft object. Push only with mild to moderate intensity, building up tension gradually. Keep your jaw and forehead relaxed.  Hold 10 to 20 seconds. Keep your breathing relaxed.  Release the tension slowly. Relax your neck  muscles completely before you start the next repetition. Repeat 2-3 times. Complete this exercise 1 time per day.  STRENGTH - Cervical Extensors, Isometric   Stand about 6 inches away from a wall. Place a small pillow, a ball about 6-8 inches in diameter, or a folded towel between the back of your head and the wall.  Slightly tuck your chin and gently tilt your head back into the soft object. Push only with mild to moderate intensity, building up tension gradually. Keep your jaw and forehead relaxed.  Hold 10 to 20 seconds. Keep your breathing relaxed.  Release the tension slowly. Relax your neck muscles completely before you start the next repetition. Repeat 2-3 times. Complete this exercise 1 time per day.  POSTURE AND BODY MECHANICS CONSIDERATIONS Keeping correct posture when sitting, standing or completing your activities will reduce the stress put on different body tissues, allowing injured tissues a chance to heal and limiting painful experiences. The following are general guidelines for improved posture. Your physician or physical therapist will provide you with any instructions specific to your needs. While reading these guidelines, remember:  The exercises prescribed by your provider will help  you have the flexibility and strength to maintain correct postures.  The correct posture provides the optimal environment for your joints to work. All of your joints have less wear and tear when properly supported by a spine with good posture. This means you will experience a healthier, less painful body.  Correct posture must be practiced with all of your activities, especially prolonged sitting and standing. Correct posture is as important when doing repetitive low-stress activities (typing) as it is when doing a single heavy-load activity (lifting).  PROLONGED STANDING WHILE SLIGHTLY LEANING FORWARD When completing a task that requires you to lean forward while standing in one place for a  long time, place either foot up on a stationary 2- to 4-inch high object to help maintain the best posture. When both feet are on the ground, the low back tends to lose its slight inward curve. If this curve flattens (or becomes too large), then the back and your other joints will experience too much stress, fatigue more quickly, and can cause pain.   RESTING POSITIONS Consider which positions are most painful for you when choosing a resting position. If you have pain with flexion-based activities (sitting, bending, stooping, squatting), choose a position that allows you to rest in a less flexed posture. You would want to avoid curling into a fetal position on your side. If your pain worsens with extension-based activities (prolonged standing, working overhead), avoid resting in an extended position such as sleeping on your stomach. Most people will find more comfort when they rest with their spine in a more neutral position, neither too rounded nor too arched. Lying on a non-sagging bed on your side with a pillow between your knees, or on your back with a pillow under your knees will often provide some relief. Keep in mind, being in any one position for a prolonged period of time, no matter how correct your posture, can still lead to stiffness.  WALKING Walk with an upright posture. Your ears, shoulders, and hips should all line up. OFFICE WORK When working at a desk, create an environment that supports good, upright posture. Without extra support, muscles fatigue and lead to excessive strain on joints and other tissues.  CHAIR:  A chair should be able to slide under your desk when your back makes contact with the back of the chair. This allows you to work closely.  The chair's height should allow your eyes to be level with the upper part of your monitor and your hands to be slightly lower than your elbows.  Body position: ? Your feet should make contact with the floor. If this is not possible, use  a foot rest. ? Keep your ears over your shoulders. This will reduce stress on your neck and low back.  EXERCISES  RANGE OF MOTION (ROM) AND STRETCHING EXERCISES - Low Back Pain Most people with lower back pain will find that their symptoms get worse with excessive bending forward (flexion) or arching at the lower back (extension). The exercises that will help resolve your symptoms will focus on the opposite motion.  If you have pain, numbness or tingling which travels down into your buttocks, leg or foot, the goal of the therapy is for these symptoms to move closer to your back and eventually resolve. Sometimes, these leg symptoms will get better, but your lower back pain may worsen. This is often an indication of progress in your rehabilitation. Be very alert to any changes in your symptoms and the activities in which  you participated in the 24 hours prior to the change. Sharing this information with your caregiver will allow him or her to most efficiently treat your condition. These exercises may help you when beginning to rehabilitate your injury. Your symptoms may resolve with or without further involvement from your physician, physical therapist or athletic trainer. While completing these exercises, remember:   Restoring tissue flexibility helps normal motion to return to the joints. This allows healthier, less painful movement and activity.  An effective stretch should be held for at least 30 seconds.  A stretch should never be painful. You should only feel a gentle lengthening or release in the stretched tissue. FLEXION RANGE OF MOTION AND STRETCHING EXERCISES:  STRETCH - Flexion, Single Knee to Chest   Lie on a firm bed or floor with both legs extended in front of you.  Keeping one leg in contact with the floor, bring your opposite knee to your chest. Hold your leg in place by either grabbing behind your thigh or at your knee.  Pull until you feel a gentle stretch in your low back. Hold  30 seconds.  Slowly release your grasp and repeat the exercise with the opposite side. Repeat 2 times. Complete this exercise 3 times per week.   STRETCH - Flexion, Double Knee to Chest  Lie on a firm bed or floor with both legs extended in front of you.  Keeping one leg in contact with the floor, bring your opposite knee to your chest.  Tense your stomach muscles to support your back and then lift your other knee to your chest. Hold your legs in place by either grabbing behind your thighs or at your knees.  Pull both knees toward your chest until you feel a gentle stretch in your low back. Hold 30 seconds.  Tense your stomach muscles and slowly return one leg at a time to the floor. Repeat 2 times. Complete this exercise 3 times per week.   STRETCH - Low Trunk Rotation  Lie on a firm bed or floor. Keeping your legs in front of you, bend your knees so they are both pointed toward the ceiling and your feet are flat on the floor.  Extend your arms out to the side. This will stabilize your upper body by keeping your shoulders in contact with the floor.  Gently and slowly drop both knees together to one side until you feel a gentle stretch in your low back. Hold for 30 seconds.  Tense your stomach muscles to support your lower back as you bring your knees back to the starting position. Repeat the exercise to the other side. Repeat 2 times. Complete this exercise at least 3 times per week.   EXTENSION RANGE OF MOTION AND FLEXIBILITY EXERCISES:  STRETCH - Extension, Prone on Elbows   Lie on your stomach on the floor, a bed will be too soft. Place your palms about shoulder width apart and at the height of your head.  Place your elbows under your shoulders. If this is too painful, stack pillows under your chest.  Allow your body to relax so that your hips drop lower and make contact more completely with the floor.  Hold this position for 30 seconds.  Slowly return to lying flat on  the floor. Repeat 2 times. Complete this exercise 3 times per week.   RANGE OF MOTION - Extension, Prone Press Ups  Lie on your stomach on the floor, a bed will be too soft. Place your palms about  shoulder width apart and at the height of your head.  Keeping your back as relaxed as possible, slowly straighten your elbows while keeping your hips on the floor. You may adjust the placement of your hands to maximize your comfort. As you gain motion, your hands will come more underneath your shoulders.  Hold this position 30 seconds.  Slowly return to lying flat on the floor. Repeat 2 times. Complete this exercise 3 times per week.   RANGE OF MOTION- Quadruped, Neutral Spine   Assume a hands and knees position on a firm surface. Keep your hands under your shoulders and your knees under your hips. You may place padding under your knees for comfort.  Drop your head and point your tailbone toward the ground below you. This will round out your lower back like an angry cat. Hold this position for 30 seconds.  Slowly lift your head and release your tail bone so that your back sags into a large arch, like an old horse.  Hold this position for 30 seconds.  Repeat this until you feel limber in your low back.  Now, find your "sweet spot." This will be the most comfortable position somewhere between the two previous positions. This is your neutral spine. Once you have found this position, tense your stomach muscles to support your low back.  Hold this position for 30 seconds. Repeat 2 times. Complete this exercise 3 times per week.   STRENGTHENING EXERCISES - Low Back Sprain These exercises may help you when beginning to rehabilitate your injury. These exercises should be done near your "sweet spot." This is the neutral, low-back arch, somewhere between fully rounded and fully arched, that is your least painful position. When performed in this safe range of motion, these exercises can be used for  people who have either a flexion or extension based injury. These exercises may resolve your symptoms with or without further involvement from your physician, physical therapist or athletic trainer. While completing these exercises, remember:   Muscles can gain both the endurance and the strength needed for everyday activities through controlled exercises.  Complete these exercises as instructed by your physician, physical therapist or athletic trainer. Increase the resistance and repetitions only as guided.  You may experience muscle soreness or fatigue, but the pain or discomfort you are trying to eliminate should never worsen during these exercises. If this pain does worsen, stop and make certain you are following the directions exactly. If the pain is still present after adjustments, discontinue the exercise until you can discuss the trouble with your caregiver.  STRENGTHENING - Deep Abdominals, Pelvic Tilt   Lie on a firm bed or floor. Keeping your legs in front of you, bend your knees so they are both pointed toward the ceiling and your feet are flat on the floor.  Tense your lower abdominal muscles to press your low back into the floor. This motion will rotate your pelvis so that your tail bone is scooping upwards rather than pointing at your feet or into the floor. With a gentle tension and even breathing, hold this position for 3 seconds. Repeat 2 times. Complete this exercise 3 times per week.   STRENGTHENING - Abdominals, Crunches   Lie on a firm bed or floor. Keeping your legs in front of you, bend your knees so they are both pointed toward the ceiling and your feet are flat on the floor. Cross your arms over your chest.  Slightly tip your chin down without bending your  neck.  Tense your abdominals and slowly lift your trunk high enough to just clear your shoulder blades. Lifting higher can put excessive stress on the lower back and does not further strengthen your abdominal  muscles.  Control your return to the starting position. Repeat 2 times. Complete this exercise 3 times per week.   STRENGTHENING - Quadruped, Opposite UE/LE Lift   Assume a hands and knees position on a firm surface. Keep your hands under your shoulders and your knees under your hips. You may place padding under your knees for comfort.  Find your neutral spine and gently tense your abdominal muscles so that you can maintain this position. Your shoulders and hips should form a rectangle that is parallel with the floor and is not twisted.  Keeping your trunk steady, lift your right hand no higher than your shoulder and then your left leg no higher than your hip. Make sure you are not holding your breath. Hold this position for 30 seconds.  Continuing to keep your abdominal muscles tense and your back steady, slowly return to your starting position. Repeat with the opposite arm and leg. Repeat 2 times. Complete this exercise 3 times per week.   STRENGTHENING - Abdominals and Quadriceps, Straight Leg Raise   Lie on a firm bed or floor with both legs extended in front of you.  Keeping one leg in contact with the floor, bend the other knee so that your foot can rest flat on the floor.  Find your neutral spine, and tense your abdominal muscles to maintain your spinal position throughout the exercise.  Slowly lift your straight leg off the floor about 6 inches for a count of 3, making sure to not hold your breath.  Still keeping your neutral spine, slowly lower your leg all the way to the floor. Repeat this exercise with each leg 2 times. Complete this exercise 3 times per week.  POSTURE AND BODY MECHANICS CONSIDERATIONS - Low Back Sprain Keeping correct posture when sitting, standing or completing your activities will reduce the stress put on different body tissues, allowing injured tissues a chance to heal and limiting painful experiences. The following are general guidelines for improved  posture.  While reading these guidelines, remember:  The exercises prescribed by your provider will help you have the flexibility and strength to maintain correct postures.  The correct posture provides the best environment for your joints to work. All of your joints have less wear and tear when properly supported by a spine with good posture. This means you will experience a healthier, less painful body.  Correct posture must be practiced with all of your activities, especially prolonged sitting and standing. Correct posture is as important when doing repetitive low-stress activities (typing) as it is when doing a single heavy-load activity (lifting).  RESTING POSITIONS Consider which positions are most painful for you when choosing a resting position. If you have pain with flexion-based activities (sitting, bending, stooping, squatting), choose a position that allows you to rest in a less flexed posture. You would want to avoid curling into a fetal position on your side. If your pain worsens with extension-based activities (prolonged standing, working overhead), avoid resting in an extended position such as sleeping on your stomach. Most people will find more comfort when they rest with their spine in a more neutral position, neither too rounded nor too arched. Lying on a non-sagging bed on your side with a pillow between your knees, or on your back with a pillow  under your knees will often provide some relief. Keep in mind, being in any one position for a prolonged period of time, no matter how correct your posture, can still lead to stiffness.  PROPER SITTING POSTURE In order to minimize stress and discomfort on your spine, you must sit with correct posture. Sitting with good posture should be effortless for a healthy body. Returning to good posture is a gradual process. Many people can work toward this most comfortably by using various supports until they have the flexibility and strength to  maintain this posture on their own. When sitting with proper posture, your ears will fall over your shoulders and your shoulders will fall over your hips. You should use the back of the chair to support your upper back. Your lower back will be in a neutral position, just slightly arched. You may place a small pillow or folded towel at the base of your lower back for  support.  When working at a desk, create an environment that supports good, upright posture. Without extra support, muscles tire, which leads to excessive strain on joints and other tissues. Keep these recommendations in mind:  CHAIR:  A chair should be able to slide under your desk when your back makes contact with the back of the chair. This allows you to work closely.  The chair's height should allow your eyes to be level with the upper part of your monitor and your hands to be slightly lower than your elbows.  BODY POSITION  Your feet should make contact with the floor. If this is not possible, use a foot rest.  Keep your ears over your shoulders. This will reduce stress on your neck and low back.  INCORRECT SITTING POSTURES  If you are feeling tired and unable to assume a healthy sitting posture, do not slouch or slump. This puts excessive strain on your back tissues, causing more damage and pain. Healthier options include:  Using more support, like a lumbar pillow.  Switching tasks to something that requires you to be upright or walking.  Talking a brief walk.  Lying down to rest in a neutral-spine position.  PROLONGED STANDING WHILE SLIGHTLY LEANING FORWARD  When completing a task that requires you to lean forward while standing in one place for a long time, place either foot up on a stationary 2-4 inch high object to help maintain the best posture. When both feet are on the ground, the lower back tends to lose its slight inward curve. If this curve flattens (or becomes too large), then the back and your other joints  will experience too much stress, tire more quickly, and can cause pain.  CORRECT STANDING POSTURES Proper standing posture should be assumed with all daily activities, even if they only take a few moments, like when brushing your teeth. As in sitting, your ears should fall over your shoulders and your shoulders should fall over your hips. You should keep a slight tension in your abdominal muscles to brace your spine. Your tailbone should point down to the ground, not behind your body, resulting in an over-extended swayback posture.   INCORRECT STANDING POSTURES  Common incorrect standing postures include a forward head, locked knees and/or an excessive swayback. WALKING Walk with an upright posture. Your ears, shoulders and hips should all line-up.  PROLONGED ACTIVITY IN A FLEXED POSITION When completing a task that requires you to bend forward at your waist or lean over a low surface, try to find a way to stabilize  3 out of 4 of your limbs. You can place a hand or elbow on your thigh or rest a knee on the surface you are reaching across. This will provide you more stability, so that your muscles do not tire as quickly. By keeping your knees relaxed, or slightly bent, you will also reduce stress across your lower back. CORRECT LIFTING TECHNIQUES  DO :  Assume a wide stance. This will provide you more stability and the opportunity to get as close as possible to the object which you are lifting.  Tense your abdominals to brace your spine. Bend at the knees and hips. Keeping your back locked in a neutral-spine position, lift using your leg muscles. Lift with your legs, keeping your back straight.  Test the weight of unknown objects before attempting to lift them.  Try to keep your elbows locked down at your sides in order get the best strength from your shoulders when carrying an object.     Always ask for help when lifting heavy or awkward objects. INCORRECT LIFTING TECHNIQUES DO NOT:    Lock your knees when lifting, even if it is a small object.  Bend and twist. Pivot at your feet or move your feet when needing to change directions.  Assume that you can safely pick up even a paperclip without proper posture.

## 2018-10-15 ENCOUNTER — Telehealth: Payer: Self-pay

## 2018-10-15 ENCOUNTER — Ambulatory Visit (INDEPENDENT_AMBULATORY_CARE_PROVIDER_SITE_OTHER): Payer: BC Managed Care – PPO | Admitting: Family

## 2018-10-15 ENCOUNTER — Encounter: Payer: Self-pay | Admitting: Family

## 2018-10-15 ENCOUNTER — Other Ambulatory Visit: Payer: Self-pay

## 2018-10-15 DIAGNOSIS — S060X0A Concussion without loss of consciousness, initial encounter: Secondary | ICD-10-CM | POA: Diagnosis not present

## 2018-10-15 DIAGNOSIS — M25562 Pain in left knee: Secondary | ICD-10-CM | POA: Diagnosis not present

## 2018-10-15 MED ORDER — TRAMADOL HCL 50 MG PO TABS: 50.0000 mg | ORAL_TABLET | Freq: Three times a day (TID) | ORAL | 0 refills | Status: AC | PRN

## 2018-10-15 MED ORDER — ONDANSETRON HCL 4 MG PO TABS: 4.0000 mg | ORAL_TABLET | Freq: Three times a day (TID) | ORAL | 0 refills | Status: DC | PRN
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
## 2018-10-18 ENCOUNTER — Ambulatory Visit (INDEPENDENT_AMBULATORY_CARE_PROVIDER_SITE_OTHER): Payer: BC Managed Care – PPO | Admitting: Family Medicine

## 2018-10-18 ENCOUNTER — Ambulatory Visit: Payer: Self-pay

## 2018-10-18 ENCOUNTER — Encounter: Payer: Self-pay | Admitting: Family Medicine

## 2018-10-18 ENCOUNTER — Ambulatory Visit (INDEPENDENT_AMBULATORY_CARE_PROVIDER_SITE_OTHER)
Admission: RE | Admit: 2018-10-18 | Discharge: 2018-10-18 | Disposition: A | Discharge status: Home / Self Care | Payer: BC Managed Care – PPO | Source: Ambulatory Visit | Attending: Family Medicine | Admitting: Family Medicine

## 2018-10-18 ENCOUNTER — Other Ambulatory Visit: Payer: Self-pay

## 2018-10-18 VITALS — BP 122/82 | HR 86 | Ht 72.0 in

## 2018-10-18 DIAGNOSIS — S060X9A Concussion with loss of consciousness of unspecified duration, initial encounter: Secondary | ICD-10-CM | POA: Insufficient documentation

## 2018-10-18 DIAGNOSIS — M25562 Pain in left knee: Secondary | ICD-10-CM | POA: Diagnosis not present

## 2018-10-18 DIAGNOSIS — S060X0A Concussion without loss of consciousness, initial encounter: Secondary | ICD-10-CM | POA: Diagnosis not present

## 2018-10-18 DIAGNOSIS — M2352 Chronic instability of knee, left knee: Secondary | ICD-10-CM | POA: Diagnosis not present

## 2018-10-18 DIAGNOSIS — S060XAA Concussion with loss of consciousness status unknown, initial encounter: Secondary | ICD-10-CM | POA: Insufficient documentation

## 2018-10-18 NOTE — Patient Instructions (Signed)
Good to see you 200mg  CoQ10 2 grams of Fish oil 2000 IU of Vitamin D See me again in one week
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
## 2018-10-18 NOTE — Assessment & Plan Note (Signed)
Signs and symptoms on correspond with a concussion.  Mild to moderate in severity.  Patient does have a job though that would make it very difficult to work at this moment we will hold patient out of work for the next week and we will reevaluate at that time.  Discussed with patient about red flags and when to seek medical attention.  Follow-up again in 1 week

## 2018-10-23 NOTE — Progress Notes (Signed)
Subjective:   I, Natalie Kane, am serving as a scribe for Dr. Hulan Saas.  Chief Complaint: Natalie Kane, DOB: Aug 11, 1980, is a 38 y.o. female who presents for head injury and left knee follow up. Patient is not any better than last visit. Constant headache and ocular issues. Also notes a twitching in her left eye.  Patient thinks that seems to be worse when she gets a little more tired.  Has had significant difficulty with sleeping still.  States that the fatigue seems to be some of the worst part.  Left knee is stiff and continues to bother her as well. Is wearing the brace for stability.  Patient states as long as she wears the brace she seems to do relatively well.  Some of the swelling has improved.  If she does not wear the brace then feels that the knee is significantly unstable.   Injury date : 10/11/2018 Visit #: 2  Previous imagine.   History of Present Illness:    Concussion Self-Reported Symptom Score Symptoms rated on a scale 1-6, in last 24 hours  Headache: 5   Nausea: 5  Vomiting: 0  Balance Difficulty: 4   Dizziness:5  Fatigue: 4  Trouble Falling Asleep: 5  Sleep More Than Usual: 0  Sleep Less Than Usual: 0  Daytime Drowsiness: 0  Photophobia: 5  Phonophobia: 3  Irritability: 5    Sadness: 5  Nervousness: 3  Feeling More Emotional: 4  Numbness or Tingling: 5  Feeling Slowed Down: 6  Feeling Mentally Foggy: 4  Difficulty Concentrating: 4  Difficulty Remembering:0  Visual Problems: 4    Total Symptom Score53 Previous Symptom Score:81  Review of Systems:  No , visual changes, nausea, vomiting, diarrhea, constipation, dizziness, abdominal pain, skin rash, fevers, chills, night sweats, weight loss, swollen lymph nodes, body aches, joint swelling, muscle aches, chest pain, shortness of breath, mood changes.   +Headache   Review of History: Past Medical History:  Past Medical History:  Diagnosis Date  . Chicken pox   . Fibroids    microscopic   . Headache(784.0)   . Hypertriglyceridemia 12/05/2015  . Pregnancy induced hypertension      Past Surgical History:  has a past surgical history that includes Cesarean section (03/13/2011) and Cholecystectomy (N/A, 10/17/2015). Family History: family history includes Cancer in her father and paternal grandmother; Hypertension in her father; Stroke in her maternal grandmother. no family history of autoimmune Social History:  reports that she has never smoked. She has never used smokeless tobacco. She reports that she does not drink alcohol or use drugs. Current Medications: has a current medication list which includes the following prescription(s): cyclobenzaprine, meloxicam, ondansetron, sumatriptan, and trazodone. Allergies: is allergic to hydrocodone-homatropine.  Objective:    Physical Examination Vitals:   10/25/18 1026  BP: 108/72  Pulse: 99  SpO2: 99%   General: No apparent distress alert and oriented x3 mood and affect normal, dressed appropriately.  HEENT: Pupils equal, extraocular movements intact severe nystagmus noted with worsening symptoms with horizontal as well as vertical. Respiratory: Patient's speak in full sentences and does not appear short of breath  Cardiovascular: No lower extremity edema, non tender, no erythema  Skin: Warm dry intact with no signs of infection or rash on extremities or on axial skeleton.  Abdomen: Soft nontender  Neuro: Cranial nerves II through XII are intact, neurovascularly intact in all extremities with 2+ DTRs and 2+ pulses.  Lymph: No lymphadenopathy of posterior or anterior cervical chain or axillae  bilaterally.  Gait severely antalgic favoring the left leg MSK:  Non tender with full range of motion and good stability and symmetric strength and tone of shoulders, elbows, wrist,  and ankles bilaterally.   Left knee exam is still has some laxity of the PCL and potentially even the ACL.  Has difficulty finding endpoint.  MCL and LCL appear to  be intact.  Trace fluid noted.  Lacks last 5 degrees of flexion in the last 5 degrees of extension Psychiatric: Oriented X3, intact recent and remote memory, judgement and insight, normal mood affect is flattened    Balance Screen: Unable to do secondary to knee  Patient Education:  Reviewed with patient the risks (i.e, a repeat concussion, post-concussion syndrome, second-impact syndrome) of returning to play prior to complete resolution, and thoroughly reviewed the signs and symptoms of concussion.Reviewed need for complete resolution of all symptoms, with rest AND exertion, prior to return to play.  Reviewed red flags for urgent medical evaluation: worsening symptoms, nausea/vomiting, intractable headache, musculoskeletal changes, focal neurological deficits.  [facility]'s Concussion Care Plan, which clearly outlines the plans stated above, was given to patient.   The above was documentation by clinical scribe has been reviewed and is accurate and complete   In addition to the time spent performing tests, I spent 26 min face to face w/ pt with greater than 50% of that time in counseling on:   Reviewed with patient the risks (i.e, a repeat concussion, post-concussion syndrome, second-impact syndrome) of returning to play prior to complete resolution, and thoroughly reviewed the signs and symptoms of      concussion. Reviewedf need for complete resolution of all symptoms, with rest AND exertion, prior to return to play.  Reviewed red flags for urgent medical evaluation: worsening symptoms, nausea/vomiting, intractable headache, musculoskeletal changes, focal neurological deficits.  [facility]'s Concussion Care Plan, which clearly outlines the plans stated above, was given to patient   After Visit Summary printed out and provided to patient as appropriate.

## 2018-10-25 ENCOUNTER — Ambulatory Visit: Payer: BC Managed Care – PPO | Admitting: Family Medicine

## 2018-10-25 ENCOUNTER — Other Ambulatory Visit: Payer: Self-pay

## 2018-10-25 ENCOUNTER — Encounter: Payer: Self-pay | Admitting: Family Medicine

## 2018-10-25 VITALS — BP 108/72 | HR 99 | Ht 72.0 in

## 2018-10-25 DIAGNOSIS — M25562 Pain in left knee: Secondary | ICD-10-CM

## 2018-10-25 DIAGNOSIS — M2352 Chronic instability of knee, left knee: Secondary | ICD-10-CM

## 2018-10-25 DIAGNOSIS — S060X0D Concussion without loss of consciousness, subsequent encounter: Secondary | ICD-10-CM | POA: Diagnosis not present

## 2018-10-25 MED ORDER — TRAZODONE HCL 50 MG PO TABS: 25.0000 mg | ORAL_TABLET | Freq: Every evening | ORAL | 3 refills | Status: DC | PRN

## 2018-10-25 NOTE — Assessment & Plan Note (Signed)
Recurrent knee instability.  We discussed with patient in great length.  We discussed icing regimen, home exercises, which activities of doing which wants to avoid.  Due to the instability no and patient unable to do vestibular neuro training completely with this I do feel an MRI is necessary.  Concern for an ACL or PCL rupture.  Patient could be a surgical candidate if necessary.  After advanced imaging we will discuss further.

## 2018-10-25 NOTE — Assessment & Plan Note (Signed)
Patient has made minimal improvement at this time.  Discussed with patient in great length.  I do not believe that she can work at this moment.  Patient is having fatigue and difficulty with sleep.  This does cover some of progression of a concussion.  Trazodone given for any type of sleep behavior that could be beneficial.  Discussed the vitamin supplementations.  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  Follow-up again in 2 weeks

## 2018-10-25 NOTE — Patient Instructions (Signed)
Trazadone at night Continue other vitamins Call [facility] for MRI L knee [phone] Physical therapy will call you See me again in 2 weeks

## 2018-10-28 ENCOUNTER — Ambulatory Visit
Admission: RE | Admit: 2018-10-28 | Discharge: 2018-10-28 | Disposition: A | Discharge status: Home / Self Care | Payer: BC Managed Care – PPO | Source: Ambulatory Visit | Attending: Family Medicine | Admitting: Family Medicine

## 2018-10-28 ENCOUNTER — Encounter: Payer: Self-pay | Admitting: Family Medicine

## 2018-10-28 DIAGNOSIS — M25562 Pain in left knee: Secondary | ICD-10-CM

## 2018-11-08 ENCOUNTER — Other Ambulatory Visit: Payer: Self-pay | Admitting: Family Medicine

## 2018-11-08 DIAGNOSIS — M545 Low back pain, unspecified: Secondary | ICD-10-CM

## 2018-11-08 DIAGNOSIS — M542 Cervicalgia: Secondary | ICD-10-CM

## 2018-11-09 ENCOUNTER — Other Ambulatory Visit: Payer: Self-pay

## 2018-11-09 ENCOUNTER — Encounter: Payer: Self-pay | Admitting: Family Medicine

## 2018-11-09 ENCOUNTER — Ambulatory Visit (INDEPENDENT_AMBULATORY_CARE_PROVIDER_SITE_OTHER): Payer: BC Managed Care – PPO | Admitting: Family Medicine

## 2018-11-09 VITALS — BP 128/72 | HR 84 | Ht 72.0 in | Wt 245.0 lb

## 2018-11-09 DIAGNOSIS — S060X0D Concussion without loss of consciousness, subsequent encounter: Secondary | ICD-10-CM | POA: Diagnosis not present

## 2018-11-09 DIAGNOSIS — G44321 Chronic post-traumatic headache, intractable: Secondary | ICD-10-CM | POA: Diagnosis not present
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
## 2018-11-09 NOTE — Assessment & Plan Note (Signed)
Patient did have a concussion.  Still having some pain that seems to be out of proportion.  Symptoms have gone on longer than anticipated.  I do feel that MRI brain with and without contrast would be beneficial at this time.  Patient is in agreement with this plan.  Patient does have a work that would not allow her to do light duty and hold her out again for another 2 weeks until he can get this imaging.  If imaging is normal then we will start to increase.  Follow-up again in 4 to 8 weeks.

## 2018-11-09 NOTE — Patient Instructions (Signed)
Good to see you  Wear brace less and less  MRI ordered Call [phone] to schedule When you know this then make an appointment 1-2 days after maybe virtually so we can discuss

## 2018-11-11 ENCOUNTER — Encounter: Payer: Self-pay | Admitting: Family Medicine

## 2018-11-11 MED ORDER — ONDANSETRON 4 MG PO TBDP: 4.0000 mg | ORAL_TABLET | Freq: Three times a day (TID) | ORAL | 0 refills | Status: DC | PRN

## 2018-11-16 ENCOUNTER — Other Ambulatory Visit: Payer: Self-pay | Admitting: Family Medicine

## 2018-12-03 ENCOUNTER — Other Ambulatory Visit: Payer: BC Managed Care – PPO

## 2018-12-06 ENCOUNTER — Other Ambulatory Visit: Payer: BC Managed Care – PPO

## 2018-12-07 ENCOUNTER — Other Ambulatory Visit: Payer: Self-pay | Admitting: Family Medicine

## 2018-12-07 DIAGNOSIS — M542 Cervicalgia: Secondary | ICD-10-CM

## 2018-12-07 DIAGNOSIS — M545 Low back pain, unspecified: Secondary | ICD-10-CM

## 2018-12-08 ENCOUNTER — Ambulatory Visit
Admission: RE | Admit: 2018-12-08 | Discharge: 2018-12-08 | Disposition: A | Discharge status: Home / Self Care | Payer: BC Managed Care – PPO | Source: Ambulatory Visit | Attending: Family Medicine | Admitting: Family Medicine

## 2018-12-08 ENCOUNTER — Other Ambulatory Visit: Payer: BC Managed Care – PPO

## 2018-12-08 ENCOUNTER — Other Ambulatory Visit: Payer: Self-pay

## 2018-12-08 DIAGNOSIS — G44321 Chronic post-traumatic headache, intractable: Secondary | ICD-10-CM

## 2018-12-08 MED ORDER — GADOBENATE DIMEGLUMINE 529 MG/ML IV SOLN
20.0000 mL | Freq: Once | INTRAVENOUS | Status: AC | PRN
  Administered 2018-12-08: 20 mL via INTRAVENOUS

## 2018-12-19 NOTE — Progress Notes (Signed)
  Virtual Visit via Video Note  I connected with Natalie Kane on 12/20/18 at 12:45 PM EDT by a video enabled telemedicine application and verified that I am speaking with the correct person using two identifiers.  Location: Patient: In home setting   provider: Office setting   I discussed the limitations of evaluation and management by telemedicine and the availability of in person appointments. The patient expressed understanding and agreed to proceed.  History of Present Illness: Patient is a very pleasant 38 year old female who did have a concussion and continue to have posttraumatic headaches.  Patient continued to have difficulty and was sent for an MRI.  MRI did show signs that are consistent with more of a postconcussive syndrome.  Continues to have pain when trying to increase activity.  Headaches continue to be frontal, associated with concentration, using the computer screen, did does get her eyes checked and did have a change in visual acuity recently.    Observations/Objective: Alert and oriented x3, with increasing the video visit patient became uncomfortable.  Was holding her head.  Assessment and Plan: Postconcussive syndrome   Follow Up Instructions: As stated in problem list    I discussed the assessment and treatment plan with the patient. The patient was provided an opportunity to ask questions and all were answered. The patient agreed with the plan and demonstrated an understanding of the instructions.   The patient was advised to call back or seek an in-person evaluation if the symptoms worsen or if the condition fails to improve as anticipated.  I provided 25 minutes of non-face-to-face time during this encounter.   Lyndal Pulley, DO

## 2018-12-20 ENCOUNTER — Encounter: Payer: Self-pay | Admitting: Family Medicine

## 2018-12-20 ENCOUNTER — Ambulatory Visit (INDEPENDENT_AMBULATORY_CARE_PROVIDER_SITE_OTHER): Payer: BC Managed Care – PPO | Admitting: Family Medicine

## 2018-12-20 DIAGNOSIS — F0781 Postconcussional syndrome: Secondary | ICD-10-CM | POA: Diagnosis not present

## 2018-12-20 MED ORDER — GABAPENTIN 100 MG PO CAPS: 200.0000 mg | ORAL_CAPSULE | Freq: Every day | ORAL | 3 refills | Status: DC

## 2018-12-20 MED ORDER — VENLAFAXINE HCL ER 37.5 MG PO CP24: 37.5000 mg | ORAL_CAPSULE | Freq: Every day | ORAL | 1 refills | Status: DC

## 2018-12-20 NOTE — Assessment & Plan Note (Signed)
Patient does have a postconcussive syndrome with an MRI showing chronic microhemorrhages of the frontal lobe.  Patient is continuing to have headaches related to basis.  Patient does have difficulty with concentration as well and does have a job where patient has to be somewhat ready for possible physical contact.  I would like to hold patient on another 2 weeks.  Started gabapentin and Effexor.  Following #2 in another 2 weeks to hopefully get patient back to at least part-time.

## 2018-12-23 ENCOUNTER — Telehealth: Payer: BC Managed Care – PPO | Admitting: Physician Assistant

## 2018-12-23 DIAGNOSIS — N898 Other specified noninflammatory disorders of vagina: Secondary | ICD-10-CM | POA: Diagnosis not present

## 2018-12-23 MED ORDER — FLUCONAZOLE 150 MG PO TABS: 150.0000 mg | ORAL_TABLET | Freq: Once | ORAL | 0 refills | Status: AC

## 2018-12-23 NOTE — Progress Notes (Signed)
We are sorry that you are not feeling well. Here is how we plan to help! Based on what you shared with me it looks like you: May have a yeast vaginosis  Vaginosis is an inflammation of the vagina that can result in discharge, itching and pain. The cause is usually a change in the normal balance of vaginal bacteria or an infection. Vaginosis can also result from reduced estrogen levels after menopause.  The most common causes of vaginosis are:   Bacterial vaginosis which results from an overgrowth of one on several organisms that are normally present in your vagina.   Yeast infections which are caused by a naturally occurring fungus called candida.   Vaginal atrophy (atrophic vaginosis) which results from the thinning of the vagina from reduced estrogen levels after menopause.   Trichomoniasis which is caused by a parasite and is commonly transmitted by sexual intercourse.  Factors that increase your risk of developing vaginosis include: . Medications, such as antibiotics and steroids . Uncontrolled diabetes . Use of hygiene products such as bubble bath, vaginal spray or vaginal deodorant . Douching . Wearing damp or tight-fitting clothing . Using an intrauterine device (IUD) for birth control . Hormonal changes, such as those associated with pregnancy, birth control pills or menopause . Sexual activity . Having a sexually transmitted infection  Your treatment plan is A single Diflucan (fluconazole) 150mg tablet once.  I have electronically sent this prescription into the pharmacy that you have chosen.  Be sure to take all of the medication as directed. Stop taking any medication if you develop a rash, tongue swelling or shortness of breath. Mothers who are breast feeding should consider pumping and discarding their breast milk while on these antibiotics. However, there is no consensus that infant exposure at these doses would be harmful.  Remember that medication creams can weaken latex  condoms. .   HOME CARE:  Good hygiene may prevent some types of vaginosis from recurring and may relieve some symptoms:  . Avoid baths, hot tubs and whirlpool spas. Rinse soap from your outer genital area after a shower, and dry the area well to prevent irritation. Don't use scented or harsh soaps, such as those with deodorant or antibacterial action. . Avoid irritants. These include scented tampons and pads. . Wipe from front to back after using the toilet. Doing so avoids spreading fecal bacteria to your vagina.  Other things that may help prevent vaginosis include:  . Don't douche. Your vagina doesn't require cleansing other than normal bathing. Repetitive douching disrupts the normal organisms that reside in the vagina and can actually increase your risk of vaginal infection. Douching won't clear up a vaginal infection. . Use a latex condom. Both female and female latex condoms may help you avoid infections spread by sexual contact. . Wear cotton underwear. Also wear pantyhose with a cotton crotch. If you feel comfortable without it, skip wearing underwear to bed. Yeast thrives in moist environments Your symptoms should improve in the next day or two.  GET HELP RIGHT AWAY IF:  . You have pain in your lower abdomen ( pelvic area or over your ovaries) . You develop nausea or vomiting . You develop a fever . Your discharge changes or worsens . You have persistent pain with intercourse . You develop shortness of breath, a rapid pulse, or you faint.  These symptoms could be signs of problems or infections that need to be evaluated by a medical provider now.  MAKE SURE YOU      Understand these instructions.  Will watch your condition.  Will get help right away if you are not doing well or get worse.  Your e-visit answers were reviewed by a board certified advanced clinical practitioner to complete your personal care plan. Depending upon the condition, your plan could have included  both over the counter or prescription medications. Please review your pharmacy choice to make sure that you have choses a pharmacy that is open for you to pick up any needed prescription, Your safety is important to us. If you have drug allergies check your prescription carefully.   You can use MyChart to ask questions about today's visit, request a non-urgent call back, or ask for a work or school excuse for 24 hours related to this e-Visit. If it has been greater than 24 hours you will need to follow up with your provider, or enter a new e-Visit to address those concerns. You will get a MyChart message within the next two days asking about your experience. I hope that your e-visit has been valuable and will speed your recovery.  I have spent 5 minutes in review of e-visit questionnaire, review and updating patient chart, medical decision making and response to patient.    Horst Ostermiller, PA-C   

## 2018-12-27 ENCOUNTER — Encounter: Payer: Self-pay | Admitting: Physician Assistant

## 2018-12-27 ENCOUNTER — Other Ambulatory Visit: Payer: Self-pay | Admitting: Family Medicine

## 2018-12-27 MED ORDER — FLUCONAZOLE 150 MG PO TABS: 150.0000 mg | ORAL_TABLET | Freq: Once | ORAL | 0 refills | Status: AC

## 2018-12-27 NOTE — Addendum Note (Signed)
Addended by: Waldon Merl on: 12/27/2018 08:49 AM   Modules accepted: Orders

## 2018-12-31 ENCOUNTER — Encounter: Payer: Self-pay | Admitting: Family Medicine

## 2019-01-04 ENCOUNTER — Ambulatory Visit: Payer: BC Managed Care – PPO | Admitting: Family Medicine

## 2019-01-06 ENCOUNTER — Other Ambulatory Visit: Payer: Self-pay | Admitting: Family Medicine

## 2019-01-06 ENCOUNTER — Encounter: Payer: Self-pay | Admitting: Family Medicine

## 2019-01-06 ENCOUNTER — Ambulatory Visit (INDEPENDENT_AMBULATORY_CARE_PROVIDER_SITE_OTHER): Payer: BC Managed Care – PPO | Admitting: Family Medicine

## 2019-01-06 DIAGNOSIS — F0781 Postconcussional syndrome: Secondary | ICD-10-CM | POA: Diagnosis not present

## 2019-01-06 MED ORDER — ZOLPIDEM TARTRATE 5 MG PO TABS: 5.0000 mg | ORAL_TABLET | Freq: Every evening | ORAL | 1 refills | Status: DC | PRN

## 2019-01-06 MED ORDER — TRAZODONE HCL 50 MG PO TABS: 25.0000 mg | ORAL_TABLET | Freq: Every evening | ORAL | 3 refills | Status: DC | PRN

## 2019-01-06 NOTE — Progress Notes (Signed)
Virtual Visit via Video Note  I connected with Natalie Kane on 01/06/19 at  4:00 PM EDT by a video enabled telemedicine application and verified that I am speaking with the correct person using two identifiers.  Location: Patient: In home setting Provider: In office setting   I discussed the limitations of evaluation and management by telemedicine and the availability of in person appointments. The patient expressed understanding and agreed to proceed.  History of Present Illness:  12/20/2018 Patient does have a postconcussive syndrome with an MRI showing chronic microhemorrhages of the frontal lobe.  Patient is continuing to have headaches related to basis.  Patient does have difficulty with concentration as well and does have a job where patient has to be somewhat ready for possible physical contact.  I would like to hold patient on another 2 weeks.  Started gabapentin and Effexor.  Following #2 in another 2 weeks to hopefully get patient back to at least part-time.  Update 01/06/2019 Patient last seen on 12/20/2018 for post concussive syndrome. Inquired on 12/31/2018 in a MyChart message about ongoing dizziness and was recommended to take Flonase as Zofran was not working. Patient states that Flonase did not improve her nausea thus she has discontinued use. She continues to have the following symptoms on a daily basis: headaches, loss of appetite, photophobia, nausea, and irritability. Patient has been using Effexor and Gabapentin. States that she is able to get to sleep but is having a hard time staying asleep. Has not been back to work since last visit.    Observations/Objective: Patient still seems to be somewhat uncomfortable.  Is good cognition though, did well with serial sevens.  Had greater than a 20-minute conversation with her.   Assessment and Plan: Is postconcussive syndrome with MRI findings that is consistent with patient's injury.  Discussed with patient in great length.   Continuing to have symptoms even 3 months after the injury.  We discussed that the nausea could be secondary to the Effexor even though it did initially seem to help with some of the headaches previously.  Patient will discontinue this.  Worsening symptoms though of the headaches and we will consider increasing to 75 mg.  Having difficulty with sleep still, not responding well to the gabapentin, drowsiness with the gabapentin so started on a low dose of Ambien and see if this will be beneficial as needed.  Would like to refer patient to neurocognitive physical therapy and hopefully this will be more beneficial.  Patient continues to have symptoms over the course of the next 10 to 14 days I would like laboratory work-up to make sure nothing else is contributing.  He continues to have patient out of work secondary to the physical nature of her job see me again 2 to 3 weeks virtually   Follow Up Instructions: 2 to 3 weeks virtually    I discussed the assessment and treatment plan with the patient. The patient was provided an opportunity to ask questions and all were answered. The patient agreed with the plan and demonstrated an understanding of the instructions.   The patient was advised to call back or seek an in-person evaluation if the symptoms worsen or if the condition fails to improve as anticipated.  I provided 27 minutes of face-to-face time during this encounter.   Lyndal Pulley, DO

## 2019-01-07 ENCOUNTER — Other Ambulatory Visit: Payer: Self-pay

## 2019-01-07 DIAGNOSIS — R42 Dizziness and giddiness: Secondary | ICD-10-CM

## 2019-01-07 NOTE — Progress Notes (Signed)
Spoke with patient and scheduled for next visit.

## 2019-01-11 ENCOUNTER — Other Ambulatory Visit: Payer: Self-pay | Admitting: Family Medicine

## 2019-01-18 ENCOUNTER — Encounter: Payer: Self-pay | Admitting: Family Medicine

## 2019-01-21 ENCOUNTER — Ambulatory Visit (INDEPENDENT_AMBULATORY_CARE_PROVIDER_SITE_OTHER): Payer: BC Managed Care – PPO | Admitting: Family Medicine

## 2019-01-21 ENCOUNTER — Encounter: Payer: Self-pay | Admitting: Family Medicine

## 2019-01-21 ENCOUNTER — Other Ambulatory Visit: Payer: Self-pay

## 2019-01-21 DIAGNOSIS — F0781 Postconcussional syndrome: Secondary | ICD-10-CM | POA: Diagnosis not present

## 2019-01-21 MED ORDER — ZOLPIDEM TARTRATE 10 MG PO TABS: 10.0000 mg | ORAL_TABLET | Freq: Every evening | ORAL | 1 refills | Status: DC | PRN

## 2019-01-21 NOTE — Progress Notes (Signed)
Virtual Visit via Video Note  I connected with Natalie Kane on 01/21/19 at  3:30 PM EDT by a video enabled telemedicine application and verified that I am speaking with the correct person using two identifiers.  Location: Patient: Patient in car sitting Provider: In office setting   I discussed the limitations of evaluation and management by telemedicine and the availability of in person appointments. The patient expressed understanding and agreed to proceed.  History of Present Illness: Patient is a pleasant 38 year old female who does have a postconcussive syndrome.  MRI findings suggestive for more of a contusion and seem to elongate patient symptomatology.  Has started for physical therapy and is making progress.  Patient discontinued the Effexor which is help with some of the daily headaches.  Still having difficulty with sleep though.  Was given Ambien and is able to sleep a little bit better.    Observations/Objective: Alert and oriented x3, very pleasant, seems to be in more upbeat mood than usual.  Moving neck and full range of motion.   Assessment and Plan: 38 year old female who did have a head injury and that seems to be improving.  Discontinue the Effexor and the gabapentin as well as the trazodone, responding better to the Ambien, increase to 10 mg at night if necessary.  Patient will continue with physical therapy, follow-up again in 2 weeks.  Start 20-hour maximum at work.  Hopefully fully released in 2 to 4 weeks   Follow Up Instructions: 2 weeks virtually    I discussed the assessment and treatment plan with the patient. The patient was provided an opportunity to ask questions and all were answered. The patient agreed with the plan and demonstrated an understanding of the instructions.   The patient was advised to call back or seek an in-person evaluation if the symptoms worsen or if the condition fails to improve as anticipated.  I provided 13 minutes of face-to-face  time during this encounter.   Lyndal Pulley, DO

## 2019-02-03 NOTE — Progress Notes (Signed)
Virtual Visit via Video Note  I connected with York Grice on 02/03/19 at  2:45 PM EDT by a video enabled telemedicine application and verified that I am speaking with the correct person using two identifiers.  Location: Patient: in car setting  Provider: In office setting   I discussed the limitations of evaluation and management by telemedicine and the availability of in person appointments. The patient expressed understanding and agreed to proceed.  History of Present Illness: 38 year old female with past medical history notable for postconcussive syndrome continuing to have some difficulty.  Patient has returned to work.  Was doing 20 hours.  Over the course last 2 weeks did have to leave 1 day secondary to worsening headache.  Patient though does feel like she continues to improve.  Was unable to sleep without the Ambien.  Trying not to take it on a nightly basis if possible.    Observations/Objective: Alert and oriented x3, patient able to carry a conversation well, shows good personality.   Assessment and Plan: Postconcussive syndrome.  Patient continues to improve but slow.  Patient will be released to do 40 hours a week but continue to likely have some exacerbation.  Discussed which activities of doing which wants to avoid.  Follow-up again in 4 to 8 weeks   Follow Up Instructions: 4 weeks    I discussed the assessment and treatment plan with the patient. The patient was provided an opportunity to ask questions and all were answered. The patient agreed with the plan and demonstrated an understanding of the instructions.   The patient was advised to call back or seek an in-person evaluation if the symptoms worsen or if the condition fails to improve as anticipated.  I provided 16 minutes of non-face-to-face time during this encounter.   Lyndal Pulley, DO

## 2019-02-04 ENCOUNTER — Encounter: Payer: Self-pay | Admitting: Family Medicine

## 2019-02-04 ENCOUNTER — Ambulatory Visit (INDEPENDENT_AMBULATORY_CARE_PROVIDER_SITE_OTHER): Payer: BC Managed Care – PPO | Admitting: Family Medicine

## 2019-02-04 DIAGNOSIS — F0781 Postconcussional syndrome: Secondary | ICD-10-CM

## 2019-02-04 MED ORDER — ZOLPIDEM TARTRATE 10 MG PO TABS: 10.0000 mg | ORAL_TABLET | Freq: Every evening | ORAL | 3 refills | Status: DC | PRN

## 2019-02-14 ENCOUNTER — Other Ambulatory Visit: Payer: Self-pay | Admitting: Family Medicine

## 2019-02-22 ENCOUNTER — Ambulatory Visit: Payer: BC Managed Care – PPO | Admitting: Family Medicine

## 2019-02-22 ENCOUNTER — Encounter: Payer: Self-pay | Admitting: Family Medicine

## 2019-02-22 NOTE — Progress Notes (Deleted)
Virtual Visit via Video Note  I connected with York Grice on 02/22/19 at  4:15 PM EDT by a video enabled telemedicine application and verified that I am speaking with the correct person using two identifiers.  Location: Patient: in home setting  Provider: in office    I discussed the limitations of evaluation and management by telemedicine and the availability of in person appointments. The patient expressed understanding and agreed to proceed.  History of Present Illness: Patient is a 38 year old female following up for head injury.  Patient did have MRI that showed a contusion previously.  Patient has been making improvement.  Patient states     Observations/Objective:   Assessment and Plan:   Follow Up Instructions:    I discussed the assessment and treatment plan with the patient. The patient was provided an opportunity to ask questions and all were answered. The patient agreed with the plan and demonstrated an understanding of the instructions.   The patient was advised to call back or seek an in-person evaluation if the symptoms worsen or if the condition fails to improve as anticipated.  I provided *** minutes of non-face-to-face time during this encounter.   Lyndal Pulley, DO

## 2019-03-01 ENCOUNTER — Encounter: Payer: Self-pay | Admitting: Family Medicine

## 2019-03-01 ENCOUNTER — Ambulatory Visit (INDEPENDENT_AMBULATORY_CARE_PROVIDER_SITE_OTHER): Payer: BC Managed Care – PPO | Admitting: Family Medicine

## 2019-03-01 DIAGNOSIS — F0781 Postconcussional syndrome: Secondary | ICD-10-CM | POA: Diagnosis not present

## 2019-03-01 DIAGNOSIS — M2550 Pain in unspecified joint: Secondary | ICD-10-CM

## 2019-03-01 MED ORDER — GABAPENTIN 300 MG PO CAPS: 300.0000 mg | ORAL_CAPSULE | Freq: Every day | ORAL | 3 refills | Status: DC

## 2019-03-01 NOTE — Progress Notes (Signed)
Virtual Visit via Video Note  I connected with York Grice on 03/01/19 at  4:30 PM EST by a video enabled telemedicine application and verified that I am speaking with the correct person using two identifiers.  Location: Patient: In car setting  provider: In office setting   I discussed the limitations of evaluation and management by telemedicine and the availability of in person appointments. The patient expressed understanding and agreed to proceed.  History of Present Illness: 38 year old female who has postconcussive syndrome.  Has returned to work and has been doing relatively well on most days.  Patient now has had some difficulty where she is calling out usually once a week for a headache that does not seem to respond until time.  Patient has not noticed any significant triggers, has had difficulty with different medications including Effexor long-term.    Observations/Objective: Alert and oriented x3 patient was driving.  Seems to be coherent.   Assessment and Plan: Continues to have some postconcussive syndrome and some headaches.  Increase gabapentin to 300 mg, discussed icing regimen and home exercises, discussed which activities to doing which wants to avoid.  Patient will have laboratory work-up to further evaluate to see if there is any other organic pathology that could be contributing to some of the headaches.  Patient will continue with the same limitations at work.  Follow-up with me again after laboratory work-up to discuss   Follow Up Instructions: after labs      I discussed the assessment and treatment plan with the patient. The patient was provided an opportunity to ask questions and all were answered. The patient agreed with the plan and demonstrated an understanding of the instructions.   The patient was advised to call back or seek an in-person evaluation if the symptoms worsen or if the condition fails to improve as anticipated.  I provided 26 minutes of  face-to-face time during this encounter.   Lyndal Pulley, DO

## 2019-03-02 ENCOUNTER — Other Ambulatory Visit (INDEPENDENT_AMBULATORY_CARE_PROVIDER_SITE_OTHER): Payer: BC Managed Care – PPO

## 2019-03-02 DIAGNOSIS — M2550 Pain in unspecified joint: Secondary | ICD-10-CM

## 2019-03-02 LAB — CBC WITH DIFFERENTIAL/PLATELET
Basophils Absolute: 0.1 10*3/uL (ref 0.0–0.1)
Basophils Relative: 1.5 % (ref 0.0–3.0)
Eosinophils Absolute: 0.1 10*3/uL (ref 0.0–0.7)
Eosinophils Relative: 1.3 % (ref 0.0–5.0)
HCT: 34.6 % — ABNORMAL LOW (ref 36.0–46.0)
Hemoglobin: 11.5 g/dL — ABNORMAL LOW (ref 12.0–15.0)
Lymphocytes Relative: 19.6 % (ref 12.0–46.0)
Lymphs Abs: 1.5 10*3/uL (ref 0.7–4.0)
MCHC: 33.2 g/dL (ref 30.0–36.0)
MCV: 90.5 fl (ref 78.0–100.0)
Monocytes Absolute: 0.4 10*3/uL (ref 0.1–1.0)
Monocytes Relative: 5.9 % (ref 3.0–12.0)
Neutro Abs: 5.4 10*3/uL (ref 1.4–7.7)
Neutrophils Relative %: 71.7 % (ref 43.0–77.0)
Platelets: 451 10*3/uL — ABNORMAL HIGH (ref 150.0–400.0)
RBC: 3.83 Mil/uL — ABNORMAL LOW (ref 3.87–5.11)
RDW: 12.8 % (ref 11.5–15.5)
WBC: 7.5 10*3/uL (ref 4.0–10.5)

## 2019-03-02 LAB — IBC PANEL
Iron: 94 ug/dL (ref 42–145)
Saturation Ratios: 24.8 % (ref 20.0–50.0)
Transferrin: 271 mg/dL (ref 212.0–360.0)

## 2019-03-02 LAB — TSH: TSH: 1.53 u[IU]/mL (ref 0.35–4.50)

## 2019-03-02 LAB — COMPREHENSIVE METABOLIC PANEL
ALT: 8 U/L (ref 0–35)
AST: 11 U/L (ref 0–37)
Albumin: 4.1 g/dL (ref 3.5–5.2)
Alkaline Phosphatase: 67 U/L (ref 39–117)
BUN: 8 mg/dL (ref 6–23)
CO2: 26 mEq/L (ref 19–32)
Calcium: 9.1 mg/dL (ref 8.4–10.5)
Chloride: 104 mEq/L (ref 96–112)
Creatinine, Ser: 0.74 mg/dL (ref 0.40–1.20)
GFR: 106.34 mL/min (ref >60.00)
Glucose, Bld: 93 mg/dL (ref 70–99)
Potassium: 3.7 mEq/L (ref 3.5–5.1)
Sodium: 137 mEq/L (ref 135–145)
Total Bilirubin: 0.4 mg/dL (ref 0.2–1.2)
Total Protein: 7.6 g/dL (ref 6.0–8.3)

## 2019-03-02 LAB — URIC ACID: Uric Acid, Serum: 5.6 mg/dL (ref 2.4–7.0)

## 2019-03-02 LAB — SEDIMENTATION RATE: Sed Rate: 48 mm/hr — ABNORMAL HIGH (ref 0–20)

## 2019-03-02 LAB — C-REACTIVE PROTEIN: CRP: 2.2 mg/dL (ref 0.5–20.0)

## 2019-03-02 LAB — FERRITIN: Ferritin: 34 ng/mL (ref 10.0–291.0)

## 2019-03-02 LAB — PHOSPHORUS: Phosphorus: 3.2 mg/dL (ref 2.3–4.6)

## 2019-03-02 LAB — VITAMIN D 25 HYDROXY (VIT D DEFICIENCY, FRACTURES): VITD: 21.96 ng/mL — ABNORMAL LOW (ref 30.00–100.00)

## 2019-03-02 LAB — MAGNESIUM: Magnesium: 1.7 mg/dL (ref 1.5–2.5)

## 2019-03-03 ENCOUNTER — Encounter: Payer: Self-pay | Admitting: Family Medicine

## 2019-03-03 LAB — PTH, INTACT AND CALCIUM
Calcium: 9.3 mg/dL (ref 8.6–10.2)
PTH: 43 pg/mL (ref 14–64)

## 2019-03-03 MED ORDER — VITAMIN D (ERGOCALCIFEROL) 1.25 MG (50000 UNIT) PO CAPS: 50000.0000 [IU] | ORAL_CAPSULE | ORAL | 0 refills | Status: DC

## 2019-03-04 LAB — PTH, INTACT AND CALCIUM
Calcium: 9 mg/dL (ref 8.6–10.2)
PTH: 28 pg/mL (ref 14–64)

## 2019-03-04 LAB — CYCLIC CITRUL PEPTIDE ANTIBODY, IGG: Cyclic Citrullin Peptide Ab: <16 UNITS

## 2019-03-04 LAB — ANTI-NUCLEAR AB-TITER (ANA TITER): ANA Titer 1: 1:80 {titer} — ABNORMAL HIGH

## 2019-03-04 LAB — ANGIOTENSIN CONVERTING ENZYME: Angiotensin-Converting Enzyme: 32 U/L (ref 9–67)

## 2019-03-04 LAB — RHEUMATOID FACTOR: Rheumatoid fact SerPl-aCnc: 17 IU/mL — ABNORMAL HIGH (ref <14)

## 2019-03-04 LAB — ANA: Anti Nuclear Antibody (ANA): POSITIVE — AB

## 2019-03-04 LAB — CALCIUM, IONIZED: Calcium, Ion: 5.07 mg/dL (ref 4.8–5.6)

## 2019-03-09 ENCOUNTER — Other Ambulatory Visit: Payer: Self-pay

## 2019-03-09 DIAGNOSIS — Z20822 Contact with and (suspected) exposure to covid-19: Secondary | ICD-10-CM

## 2019-03-11 LAB — NOVEL CORONAVIRUS, NAA: SARS-CoV-2, NAA: NOT DETECTED

## 2019-03-14 ENCOUNTER — Encounter: Payer: Self-pay | Admitting: Family Medicine

## 2019-03-15 ENCOUNTER — Encounter: Payer: Self-pay | Admitting: *Deleted

## 2019-03-16 ENCOUNTER — Other Ambulatory Visit: Payer: Self-pay

## 2019-03-16 ENCOUNTER — Encounter: Payer: Self-pay | Admitting: Family Medicine

## 2019-03-16 ENCOUNTER — Ambulatory Visit (INDEPENDENT_AMBULATORY_CARE_PROVIDER_SITE_OTHER): Payer: BC Managed Care – PPO | Admitting: Family Medicine

## 2019-03-16 DIAGNOSIS — F0781 Postconcussional syndrome: Secondary | ICD-10-CM

## 2019-03-16 DIAGNOSIS — S060X0D Concussion without loss of consciousness, subsequent encounter: Secondary | ICD-10-CM | POA: Diagnosis not present

## 2019-03-16 DIAGNOSIS — G44329 Chronic post-traumatic headache, not intractable: Secondary | ICD-10-CM | POA: Diagnosis not present

## 2019-03-16 NOTE — Progress Notes (Signed)
Virtual Visit via Video Note  I connected with Natalie Kane on 03/16/19 at 12:30 PM EST by a video enabled telemedicine application and verified that I am speaking with the correct person using two identifiers.  Location: Patient: Patient is in her car alone Provider: In office setting   I discussed the limitations of evaluation and management by telemedicine and the availability of in person appointments. The patient expressed understanding and agreed to proceed.  History of Present Illness: 38 year old female status post head injury with postconcussive syndrome continuing to have some difficulty with chronic headaches.  Patient feels like she has plateaued at the moment.  Still calling out of work at least once a week secondary to the amount of headaches.  Describes the headaches now more at the base of the neck that then radiates to her eye.  Patient states symptoms can be associated with some visual disturbances.  Patient denies any weakness, any numbness, pain in any of the extremities.  No longer associated with any other nausea but sometimes unfortunately can affect her work fairly significantly.    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  Multitasking well.   Assessment and Plan: 38 year old female with postconcussive syndromes now with chronic headaches are severe enough that stops her from daily activities.  I would like patient to be further evaluated by neurology and headache specialist to make sure we are not missing an underlying headache condition that could be contributing.  In addition to this we will have patient come in the office and we will try osteopathic manipulation to see if that will be any beneficial as well   Follow Up Instructions: 1 week for manipulation     I discussed the assessment and treatment plan with the patient. The patient was provided an opportunity to ask questions and all  were answered. The patient agreed with the plan and demonstrated an understanding of the instructions.   The patient was advised to call back or seek an in-person evaluation if the symptoms worsen or if the condition fails to improve as anticipated.  I provided 12 minutes of face-to-face time during this encounter.   Lyndal Pulley, DO
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
## 2019-03-22 ENCOUNTER — Ambulatory Visit: Payer: BC Managed Care – PPO | Admitting: Family Medicine

## 2019-03-22 ENCOUNTER — Encounter: Payer: Self-pay | Admitting: Family Medicine

## 2019-03-24 ENCOUNTER — Telehealth: Payer: BC Managed Care – PPO | Admitting: Physician Assistant

## 2019-03-24 DIAGNOSIS — B373 Candidiasis of vulva and vagina: Secondary | ICD-10-CM

## 2019-03-24 DIAGNOSIS — B3731 Acute candidiasis of vulva and vagina: Secondary | ICD-10-CM

## 2019-03-24 MED ORDER — FLUCONAZOLE 150 MG PO TABS: 150.0000 mg | ORAL_TABLET | Freq: Once | ORAL | 0 refills | Status: AC

## 2019-03-24 NOTE — Progress Notes (Signed)
We are sorry that you are not feeling well. Here is how we plan to help! Based on what you shared with me it looks like you: May have a yeast vaginosis  Vaginosis is an inflammation of the vagina that can result in discharge, itching and pain. The cause is usually a change in the normal balance of vaginal bacteria or an infection. Vaginosis can also result from reduced estrogen levels after menopause.  The most common causes of vaginosis are:   Bacterial vaginosis which results from an overgrowth of one on several organisms that are normally present in your vagina.   Yeast infections which are caused by a naturally occurring fungus called candida.   Vaginal atrophy (atrophic vaginosis) which results from the thinning of the vagina from reduced estrogen levels after menopause.   Trichomoniasis which is caused by a parasite and is commonly transmitted by sexual intercourse.  Factors that increase your risk of developing vaginosis include: Marland Kitchen Medications, such as antibiotics and steroids . Uncontrolled diabetes . Use of hygiene products such as bubble bath, vaginal spray or vaginal deodorant . Douching . Wearing damp or tight-fitting clothing . Using an intrauterine device (IUD) for birth control . Hormonal changes, such as those associated with pregnancy, birth control pills or menopause . Sexual activity . Having a sexually transmitted infection  Your treatment plan is  Diflucan (fluconazole) 150mg  tablet, and I have sent 2 tablets for you.  I have electronically sent this prescription into the pharmacy that you have chosen.  Be sure to take all of the medication as directed. Stop taking any medication if you develop a rash, tongue swelling or shortness of breath. Mothers who are breast feeding should consider pumping and discarding their breast milk while on these antibiotics. However, there is no consensus that infant exposure at these doses would be harmful.  Remember that medication  creams can weaken latex condoms. Marland Kitchen   HOME CARE:  Good hygiene may prevent some types of vaginosis from recurring and may relieve some symptoms:  . Avoid baths, hot tubs and whirlpool spas. Rinse soap from your outer genital area after a shower, and dry the area well to prevent irritation. Don't use scented or harsh soaps, such as those with deodorant or antibacterial action. Marland Kitchen Avoid irritants. These include scented tampons and pads. . Wipe from front to back after using the toilet. Doing so avoids spreading fecal bacteria to your vagina.  Other things that may help prevent vaginosis include:  Marland Kitchen Don't douche. Your vagina doesn't require cleansing other than normal bathing. Repetitive douching disrupts the normal organisms that reside in the vagina and can actually increase your risk of vaginal infection. Douching won't clear up a vaginal infection. . Use a latex condom. Both female and female latex condoms may help you avoid infections spread by sexual contact. . Wear cotton underwear. Also wear pantyhose with a cotton crotch. If you feel comfortable without it, skip wearing underwear to bed. Yeast thrives in [employer] Your symptoms should improve in the next day or two.  GET HELP RIGHT AWAY IF:  . You have pain in your lower abdomen ( pelvic area or over your ovaries) . You develop nausea or vomiting . You develop a fever . Your discharge changes or worsens . You have persistent pain with intercourse . You develop shortness of breath, a rapid pulse, or you faint.  These symptoms could be signs of problems or infections that need to be evaluated by a medical provider now.  MAKE SURE YOU    Understand these instructions.  Will watch your condition.  Will get help right away if you are not doing well or get worse.  Your e-visit answers were reviewed by a board certified advanced clinical practitioner to complete your personal care plan. Depending upon the condition, your  plan could have included both over the counter or prescription medications. Please review your pharmacy choice to make sure that you have choses a pharmacy that is open for you to pick up any needed prescription, Your safety is important to [place]. If you have drug allergies check your prescription carefully.   You can use MyChart to ask questions about today's visit, request a non-urgent call back, or ask for a work or school excuse for 24 hours related to this e-Visit. If it has been greater than 24 hours you will need to follow up with your provider, or enter a new e-Visit to address those concerns. You will get a MyChart message within the next two days asking about your experience. I hope that your e-visit has been valuable and will speed your recovery.   Particia Nearing PA-C  Approximately 5 minutes was spent documenting and reviewing patient's chart.

## 2019-03-28 ENCOUNTER — Ambulatory Visit (INDEPENDENT_AMBULATORY_CARE_PROVIDER_SITE_OTHER): Payer: BC Managed Care – PPO | Admitting: Family

## 2019-03-28 ENCOUNTER — Encounter: Payer: Self-pay | Admitting: Family

## 2019-03-28 DIAGNOSIS — R519 Headache, unspecified: Secondary | ICD-10-CM | POA: Diagnosis not present
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
## 2019-03-29 ENCOUNTER — Telehealth (INDEPENDENT_AMBULATORY_CARE_PROVIDER_SITE_OTHER): Payer: BC Managed Care – PPO | Admitting: Neurology

## 2019-03-29 ENCOUNTER — Other Ambulatory Visit: Payer: Self-pay

## 2019-03-29 ENCOUNTER — Encounter: Payer: Self-pay | Admitting: Neurology

## 2019-03-29 VITALS — Ht 72.0 in | Wt 225.0 lb

## 2019-03-29 DIAGNOSIS — F0781 Postconcussional syndrome: Secondary | ICD-10-CM | POA: Diagnosis not present

## 2019-03-29 DIAGNOSIS — G43009 Migraine without aura, not intractable, without status migrainosus: Secondary | ICD-10-CM | POA: Diagnosis not present

## 2019-03-29 MED ORDER — NORTRIPTYLINE HCL 10 MG PO CAPS: 10.0000 mg | ORAL_CAPSULE | Freq: Every day | ORAL | 3 refills | Status: DC

## 2019-03-29 MED ORDER — RIZATRIPTAN BENZOATE 10 MG PO TABS: ORAL_TABLET | ORAL | 3 refills | Status: DC
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
## 2019-03-30 ENCOUNTER — Ambulatory Visit: Payer: BC Managed Care – PPO | Admitting: Family

## 2019-03-30 ENCOUNTER — Encounter: Payer: Self-pay | Admitting: Family

## 2019-03-30 ENCOUNTER — Other Ambulatory Visit (HOSPITAL_COMMUNITY)
Admission: RE | Admit: 2019-03-30 | Discharge: 2019-03-30 | Disposition: A | Discharge status: Home / Self Care | Payer: BC Managed Care – PPO | Source: Ambulatory Visit | Attending: Family | Admitting: Family

## 2019-03-30 VITALS — BP 125/81 | HR 85 | Temp 96.8°F | Resp 16 | Ht 72.0 in | Wt 252.0 lb

## 2019-03-30 DIAGNOSIS — F439 Reaction to severe stress, unspecified: Secondary | ICD-10-CM | POA: Diagnosis not present

## 2019-03-30 DIAGNOSIS — N760 Acute vaginitis: Secondary | ICD-10-CM | POA: Diagnosis present

## 2019-03-30 NOTE — Patient Instructions (Signed)
Please consider schedule an appointment with a counselor. We will let you know how your test turns out.

## 2019-03-30 NOTE — Addendum Note (Signed)
Addended by: Jiles Prows on: 03/30/2019 02:49 PM   Modules accepted: Orders
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
## 2019-04-01 ENCOUNTER — Other Ambulatory Visit: Payer: Self-pay

## 2019-04-01 ENCOUNTER — Telehealth: Payer: Self-pay | Admitting: Family

## 2019-04-01 ENCOUNTER — Encounter: Payer: Self-pay | Admitting: Family

## 2019-04-01 LAB — CERVICOVAGINAL ANCILLARY ONLY
Bacterial Vaginitis (gardnerella): NEGATIVE
Candida Glabrata: NEGATIVE
Candida Vaginitis: NEGATIVE
Comment: NEGATIVE
Comment: NEGATIVE
Comment: NEGATIVE

## 2019-04-01 MED ORDER — FLUCONAZOLE 150 MG PO TABS: 150.0000 mg | ORAL_TABLET | Freq: Once | ORAL | 0 refills | Status: AC

## 2019-04-01 MED ORDER — METRONIDAZOLE 500 MG PO TABS: 500.0000 mg | ORAL_TABLET | Freq: Two times a day (BID) | ORAL | 0 refills | Status: AC

## 2019-04-01 NOTE — Telephone Encounter (Signed)
Patient inquiring about lab results taken on 03/30/2019 , patient was seen for Vaginitis and states symptoms expressed on 03/30/2019 have not improved and would like DOD to send in a Rx, please advise patient directly  [id number] - [place], [place]

## 2019-04-01 NOTE — Telephone Encounter (Signed)
[place]  Per DOD, Dr. Nani Ravens rx was sent in for flagyl and one diflucan in case she gets a yeast infection

## 2019-04-04 ENCOUNTER — Telehealth: Payer: Self-pay | Admitting: Family

## 2019-04-04 MED ORDER — FLUCONAZOLE 150 MG PO TABS: ORAL_TABLET | ORAL | 0 refills | Status: DC

## 2019-04-04 NOTE — Telephone Encounter (Signed)
See my chart message

## 2019-04-11 ENCOUNTER — Telehealth: Payer: BC Managed Care – PPO | Admitting: Physician Assistant

## 2019-04-11 DIAGNOSIS — R399 Unspecified symptoms and signs involving the genitourinary system: Secondary | ICD-10-CM

## 2019-04-11 MED ORDER — PHENAZOPYRIDINE HCL 200 MG PO TABS: 200.0000 mg | ORAL_TABLET | Freq: Three times a day (TID) | ORAL | 0 refills | Status: DC | PRN

## 2019-04-11 MED ORDER — IBUPROFEN 800 MG PO TABS: 800.0000 mg | ORAL_TABLET | Freq: Three times a day (TID) | ORAL | 0 refills | Status: AC | PRN

## 2019-04-11 MED ORDER — NITROFURANTOIN MONOHYD MACRO 100 MG PO CAPS: 100.0000 mg | ORAL_CAPSULE | Freq: Two times a day (BID) | ORAL | 0 refills | Status: DC

## 2019-04-11 MED ORDER — NITROFURANTOIN MONOHYD MACRO 100 MG PO CAPS: 100.0000 mg | ORAL_CAPSULE | Freq: Two times a day (BID) | ORAL | 0 refills | Status: AC

## 2019-04-11 NOTE — Progress Notes (Signed)

## 2019-04-11 NOTE — Addendum Note (Signed)
Addended by: Dorise Hiss on: 04/11/2019 03:18 PM   Modules accepted: Orders

## 2019-04-11 NOTE — Addendum Note (Signed)
Addended by: Dorise Hiss on: 04/11/2019 03:54 PM   Modules accepted: Orders

## 2019-04-22 ENCOUNTER — Other Ambulatory Visit: Payer: Self-pay | Admitting: Neurology

## 2019-04-24 NOTE — Progress Notes (Signed)
[facility] [address], [place] [address] Phone: [phone] Subjective:    I'm seeing this patient by the request  of:    CC: no show  [id number]   03/16/2019 38 year old female with postconcussive syndromes now with chronic headaches are severe enough that stops her from daily activities.  I would like patient to be further evaluated by neurology and headache specialist to make sure we are not missing an underlying headache condition that could be contributing.  In addition to this we will have patient come in the office and we will try osteopathic manipulation to see if that will be any beneficial as well  Update 04/25/2019 Natalie Kane is a 38 y.o. female coming in with complaint of headaches. Patient states      Past Medical History:  Diagnosis Date  . Chicken pox   . Fibroids    microscopic  . Headache(784.0)   . Hypertriglyceridemia 12/05/2015  . Pregnancy induced hypertension    Past Surgical History:  Procedure Laterality Date  . CESAREAN SECTION  03/13/2011   Procedure: CESAREAN SECTION;  Surgeon: Shon Millet II;  Location: [facility];  Service: Gynecology;  Laterality: N/A;  . CHOLECYSTECTOMY N/A 10/17/2015   Procedure: LAPAROSCOPIC CHOLECYSTECTOMY;  Surgeon: Arta Bruce Kinsinger, MD;  Location: [facility];  Service: General;  Laterality: N/A;   Social History   Socioeconomic History  . Marital status: Divorced    Spouse name: Not on file  . Number of children: 1  . Years of education: Not on file  . Highest education level: Bachelor's degree (e.g., BA, AB, BS)  Occupational History  . Not on file  Tobacco Use  . Smoking status: Never Smoker  . Smokeless tobacco: Never Used  Substance and Sexual Activity  . Alcohol use: No  . Drug use: No  . Sexual activity: Not Currently  Other Topics Concern  . 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    Drinks coffee once a month, tea 3 times a week, soda one a week.    Social Determinants of Health   Financial Resource Strain:   . Difficulty of Paying Living Expenses: Not on file  Food Insecurity:   . Worried About Charity fundraiser in the Last Year: Not on file  . Ran Out of Food in the Last Year: Not on file  Transportation Needs:   . Lack of Transportation (Medical): Not on file  . Lack of Transportation (Non-Medical): Not on file  Physical Activity:   . Days of Exercise per Week: Not on file  . Minutes of Exercise per Session: Not on file  Stress:   . Feeling of Stress : Not on file  Social Connections:   . Frequency of Communication with Friends and Family: Not on file  . Frequency of Social Gatherings with Friends and Family: Not on file  . Attends Religious Services: Not on file  . Active Member of Clubs or Organizations: Not on file  . Attends Archivist Meetings: Not on file  . Marital Status: Not on file   Allergies  Allergen Reactions  . Hydrocodone-Homatropine     vomitting   Family History  Problem Relation Age of Onset  . Hypertension Father   . Cancer Father        prostate  . Stroke Maternal  Grandmother   . Cancer Paternal Grandmother        colon  . Anesthesia problems Neg Hx   . Hypotension Neg Hx   . Malignant hyperthermia Neg Hx   . Pseudochol deficiency Neg Hx     Current Outpatient Medications (Endocrine & Metabolic):  Marland Kitchen  BLISOVI 24 FE 1-20 MG-MCG(24) tablet, Take 1 tablet by mouth daily.    Current Outpatient Medications (Analgesics):  .  rizatriptan (MAXALT) 10 MG tablet, Take 1 tablet earliest onset of headache.  May repeat in 2 hours if needed. Maximum 2 tablets in 24 hours.   Current Outpatient Medications (Other):  .  fluconazole (DIFLUCAN) 150 MG tablet, Take 1 tablet by mouth on day one, may repeat in 3 days if symptoms fail to improve. .  gabapentin (NEURONTIN) 300 MG  capsule, Take 1 capsule (300 mg total) by mouth at bedtime. .  nortriptyline (PAMELOR) 10 MG capsule, Take 1 capsule (10 mg total) by mouth at bedtime. .  phenazopyridine (PYRIDIUM) 200 MG tablet, Take 1 tablet (200 mg total) by mouth 3 (three) times daily as needed for pain. .  Vitamin D, Ergocalciferol, (DRISDOL) 1.25 MG (50000 UT) CAPS capsule, Take 1 capsule (50,000 Units total) by mouth every 7 (seven) days.    Past medical history, social, surgical and family history all reviewed in electronic medical record.  No pertanent information unless stated regarding to the chief complaint.        Impression and Recommendations:     This case required medical decision making of moderate complexity. The above documentation has been reviewed and is accurate and complete Lyndal Pulley, DO       Note: This dictation was prepared with Dragon dictation along with smaller phrase technology. Any transcriptional errors that result from this process are unintentional.

## 2019-04-24 NOTE — Assessment & Plan Note (Signed)
Patient has had some difficulty with postconcussive syndrome.  Patient in great length.  Discussed icing regimen and home exercises, now attempting osteopathic manipulation for any cervicogenic headaches activity contributing.  Tinea same medications at the moment but I would like to see if we can get her off some of them in the near future.  Follow-up again in 6 to 8 weeks

## 2019-04-25 ENCOUNTER — Ambulatory Visit: Payer: BC Managed Care – PPO | Admitting: Family Medicine

## 2019-05-03 ENCOUNTER — Other Ambulatory Visit: Payer: Self-pay | Admitting: Family Medicine

## 2019-05-20 ENCOUNTER — Other Ambulatory Visit: Payer: Self-pay | Admitting: Neurology

## 2019-05-20 MED ORDER — NORTRIPTYLINE HCL 25 MG PO CAPS: 25.0000 mg | ORAL_CAPSULE | Freq: Every day | ORAL | 1 refills | Status: DC

## 2019-05-21 ENCOUNTER — Other Ambulatory Visit: Payer: Self-pay | Admitting: Family Medicine

## 2019-05-24 ENCOUNTER — Encounter: Payer: Self-pay | Admitting: Family Medicine

## 2019-05-24 MED ORDER — GABAPENTIN 400 MG PO CAPS: 400.0000 mg | ORAL_CAPSULE | Freq: Every day | ORAL | 1 refills | Status: DC

## 2019-06-15 ENCOUNTER — Other Ambulatory Visit: Payer: Self-pay | Admitting: Neurology

## 2019-06-20 ENCOUNTER — Encounter: Payer: Self-pay | Admitting: Family Medicine

## 2019-06-22 ENCOUNTER — Encounter: Payer: Self-pay | Admitting: Family

## 2019-06-24 ENCOUNTER — Ambulatory Visit: Payer: BC Managed Care – PPO | Admitting: Nurse Practitioner

## 2019-06-24 ENCOUNTER — Encounter: Payer: Self-pay | Admitting: Nurse Practitioner

## 2019-06-24 ENCOUNTER — Ambulatory Visit: Payer: BC Managed Care – PPO | Admitting: Family Medicine

## 2019-06-24 ENCOUNTER — Other Ambulatory Visit: Payer: Self-pay

## 2019-06-24 DIAGNOSIS — J069 Acute upper respiratory infection, unspecified: Secondary | ICD-10-CM

## 2019-06-24 NOTE — Patient Instructions (Addendum)
Advised of CDC guidelines for self isolation until COVID test results are known.   Instructed to rest and hydrate well.  Monitor for fever. Tylenol as directed on the bottle. Please call us  back on Monday for an update.   Over the weekend, monitor for any worsening symptoms; watch for shortness of breath, weakness, and signs of dehydration. If symptoms worsen  seek medical care at any Urgent Care for an in-person examination.   Work note provided and sent to [employer]. Perhaps you can take a picture of it on your phone if you need to forward it to your work. I am not able to send it to your personal e-mail account.

## 2019-06-24 NOTE — Telephone Encounter (Signed)
Patient scheduled for virtual telephone visit today at 4 Pm

## 2019-06-24 NOTE — Telephone Encounter (Signed)
Patient scheduled for video visit for this afternoon at 4 pm

## 2019-06-24 NOTE — Progress Notes (Signed)
Patient ID: Natalie Kane, female   DOB: Sep 08, 1980, 39 y.o.   MRN: [id number]  I connected with Natalie Kane on 06/24/2019 by telephone and verified that I am speaking with the correct person using two identifiers.   Location:  Patient: home Provider:office setting    I discussed the limitations, risks, security and privacy concerns of performing an evaluation and management service by telephone and the availability of in person appointments. I also discussed with the patient that there may be a patient responsible charge related to this service. The patient expressed understanding and agreed to proceed.    History of Present Illness:  Pt is a 39 yo female who reports an exposure to Covid-19 one week ago at work. She took a Covid test last week that was negative while feeling well. She has been quarantined this week and due to return to work on Monday.  She developed URI symptoms two days ago. [facility] and [facility] are [facility]. Her symptoms include: 2 day hx of mild sore throat, progressed to nasal congestion, cough, fatigue and body aches. Mild HA yesterday.  She had a fever of 100.3 yesterday. She did not check her temperature today, but  did not feel warm. No chills. A little nausea and decreased appetite and taste. No loss of smell. No vomiting or diarrhea. Coughing some phlegm and clears it. No wheezing, tightness, or SOB. Chest pain-only when she coughs. She tookTylenol 500mg  yest and every 6 hours since yesterday with minimal relief.    Observations/Objective: Gen: Patient is awake and conversing normally Resp: Breathing sounds even and non labored, intermittent dry cough noted.  ENT: voice hoarseness and nasal congestion noted Psych: Calm, pleasant demeanor Neuro: Alert and oriented x3, speech clear   Assessment and Plan:   Viral URI. Strongly suspect Covid -19 . Await results.  Patient advised as follows: Advised of CDC guidelines for self  isolation until COVID test results are known.   Instructed to rest and hydrate well.  Monitor for fever. Tylenol as directed on the bottle. Please call us  back on Monday for an update.   Over the weekend, monitor for any worsening symptoms; watch for shortness of breath, weakness, and signs of dehydration. If symptoms worsen  seek medical care at any Urgent Care for an in-person examination.  Work note provided and sent to [employer]. Perhaps you can take a picture of it on your phone if you need to forward it to your work. I am not able to send it to your personal e-mail account.    Follow Up Instructions:     I discussed the assessment and treatment plan with the patient. The patient was provided an opportunity to ask questions and all were answered. The patient agreed with the plan and demonstrated an understanding of the instructions.     The patient was advised to call back or seek an in-person evaluation if the symptoms worsen or if the condition fails to improve as anticipated.   I provided 22 minutes of non-face-to-face time during this encounter.

## 2019-06-24 NOTE — Telephone Encounter (Signed)
Natalie Kane -- it looks like all the Providers schedules are full here today. Please advise?
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
## 2019-07-11 ENCOUNTER — Encounter: Payer: Self-pay | Admitting: Neurology

## 2019-07-11 ENCOUNTER — Ambulatory Visit (INDEPENDENT_AMBULATORY_CARE_PROVIDER_SITE_OTHER): Payer: BC Managed Care – PPO | Admitting: Neurology

## 2019-07-11 ENCOUNTER — Other Ambulatory Visit: Payer: Self-pay

## 2019-07-11 ENCOUNTER — Ambulatory Visit: Payer: BC Managed Care – PPO | Admitting: Internal Medicine

## 2019-07-11 ENCOUNTER — Encounter: Payer: Self-pay | Admitting: Internal Medicine

## 2019-07-11 VITALS — BP 124/83 | HR 85 | Temp 98.4°F | Ht 72.0 in | Wt 246.0 lb

## 2019-07-11 VITALS — BP 129/77 | HR 80 | Temp 97.3°F | Resp 16 | Ht 72.0 in | Wt 246.0 lb

## 2019-07-11 DIAGNOSIS — L03111 Cellulitis of right axilla: Secondary | ICD-10-CM

## 2019-07-11 DIAGNOSIS — G43009 Migraine without aura, not intractable, without status migrainosus: Secondary | ICD-10-CM | POA: Diagnosis not present

## 2019-07-11 DIAGNOSIS — L732 Hidradenitis suppurativa: Secondary | ICD-10-CM

## 2019-07-11 MED ORDER — FLUCONAZOLE 150 MG PO TABS: 150.0000 mg | ORAL_TABLET | Freq: Once | ORAL | 0 refills | Status: AC

## 2019-07-11 MED ORDER — NORTRIPTYLINE HCL 25 MG PO CAPS: 50.0000 mg | ORAL_CAPSULE | Freq: Every day | ORAL | 3 refills | Status: DC

## 2019-07-11 MED ORDER — DOXYCYCLINE HYCLATE 100 MG PO TABS: 100.0000 mg | ORAL_TABLET | Freq: Two times a day (BID) | ORAL | 0 refills | Status: DC

## 2019-07-11 MED ORDER — NURTEC 75 MG PO TBDP: 75.0000 mg | ORAL_TABLET | ORAL | 0 refills | Status: DC | PRN

## 2019-07-11 MED ORDER — ONDANSETRON 8 MG PO TBDP: 8.0000 mg | ORAL_TABLET | Freq: Three times a day (TID) | ORAL | 4 refills | Status: DC | PRN

## 2019-07-11 NOTE — Patient Instructions (Signed)
Take the antibiotic as prescribed  Warm compress  Over-the-counter antibiotic ointment   Call if fever, chills or the redness spreads.  See your dermatologist   Hidradenitis Suppurativa Hidradenitis suppurativa is a long-term (chronic) skin disease. It is similar to a severe form of acne, but it affects areas of the body where acne would be unusual, especially areas of the body where skin rubs against skin and becomes moist. These include:  Underarms.  Groin.  Genital area.  Buttocks.  Upper thighs.  Breasts. Hidradenitis suppurativa may start out as small lumps or pimples caused by blocked sweat glands or hair follicles. Pimples may develop into deep sores that break open (rupture) and drain pus. Over time, affected areas of skin may thicken and become scarred. This condition is rare and does not spread from person to person (non-contagious). What are the causes? The exact cause of this condition is not known. It may be related to:  Female and female hormones.  An overactive disease-fighting system (immune system). The immune system may over-react to blocked hair follicles or sweat glands and cause swelling and pus-filled sores. What increases the risk? You are more likely to develop this condition if you:  Are female.  Are 17-55 years old.  Have a family history of hidradenitis suppurativa.  Have a personal history of acne.  Are overweight.  Smoke.  Take the medicine lithium. What are the signs or symptoms? The first symptoms are usually painful bumps in the skin, similar to pimples. The condition may get worse over time (progress), or it may only cause mild symptoms. If the disease progresses, symptoms may include:  Skin bumps getting bigger and growing deeper into the skin.  Bumps rupturing and draining pus.  Itchy, infected skin.  Skin getting thicker and scarred.  Tunnels under the skin (fistulas) where pus drains from a bump.  Pain during daily  activities, such as pain during walking if your groin area is affected.  Emotional problems, such as stress or depression. This condition may affect your appearance and your ability or willingness to wear certain clothes or do certain activities. How is this diagnosed? This condition is diagnosed by a health care provider who specializes in skin diseases (dermatologist). You may be diagnosed based on:  Your symptoms and medical history.  A physical exam.  Testing a pus sample for infection.  Blood tests. How is this treated? Your treatment will depend on how severe your symptoms are. The same treatment will not work for everybody with this condition. You may need to try several treatments to find what works best for you. Treatment may include:  Cleaning and bandaging (dressing) your wounds as needed.  Lifestyle changes, such as new skin care routines.  Taking medicines, such as: ? Antibiotics. ? Acne medicines. ? Medicines to reduce the activity of the immune system. ? A diabetes medicine (metformin). ? Birth control pills, for women. ? Steroids to reduce swelling and pain.  Working with a mental health care provider, if you experience emotional distress due to this condition. If you have severe symptoms that do not get better with medicine, you may need surgery. Surgery may involve:  Using a laser to clear the skin and remove hair follicles.  Opening and draining deep sores.  Removing the areas of skin that are diseased and scarred. Follow these instructions at home: Medicines   Take over-the-counter and prescription medicines only as told by your health care provider.  If you were prescribed an antibiotic medicine, take  it as told by your health care provider. Do not stop taking the antibiotic even if your condition improves. Skin care  If you have open wounds, cover them with a clean dressing as told by your health care provider. Keep wounds clean by washing them  gently with soap and water when you bathe.  Do not shave the areas where you get hidradenitis suppurativa.  Do not wear deodorant.  Wear loose-fitting clothes.  Try to avoid getting overheated or sweaty. If you get sweaty or wet, change into clean, dry clothes as soon as you can.  To help relieve pain and itchiness, cover sore areas with a warm, clean washcloth (warm compress) for 5-10 minutes as often as needed.  If told by your health care provider, take a bleach bath twice a week: ? Fill your bathtub halfway with water. ? Pour in  cup of unscented household bleach. ? Soak in the tub for 5-10 minutes. ? Only soak from the neck down. Avoid water on your face and hair. ? Shower to rinse off the bleach from your skin. General instructions  Learn as much as you can about your disease so that you have an active role in your treatment. Work closely with your health care provider to find treatments that work for you.  If you are overweight, work with your health care provider to lose weight as recommended.  Do not use any products that contain nicotine or tobacco, such as cigarettes and e-cigarettes. If you need help quitting, ask your health care provider.  If you struggle with living with this condition, talk with your health care provider or work with a mental health care provider as recommended.  Keep all follow-up visits as told by your health care provider. This is important. Where to find more information  Hidradenitis [place].: [email or web address]-[email or web address]/ Contact a health care provider if you have:  A flare-up of hidradenitis suppurativa.  A fever or chills.  Trouble controlling your symptoms at home.  Trouble doing your daily activities because of your symptoms.  Trouble dealing with emotional problems related to your condition. Summary  Hidradenitis suppurativa is a long-term (chronic) skin disease. It is similar to a severe form of acne, but  it affects areas of the body where acne would be unusual.  The first symptoms are usually painful bumps in the skin, similar to pimples. The condition may get worse over time (progress), or it may only cause mild symptoms.  If you have open wounds, cover them with a clean dressing as told by your health care provider. Keep wounds clean by washing them gently with soap and water when you bathe.  Besides skin care, treatment may include medicines, laser treatment, and surgery. This information is not intended to replace advice given to you by your health care provider. Make sure you discuss any questions you have with your health care provider. Document Revised: 04/22/2017 Document Reviewed: 04/22/2017 Elsevier Patient Education  2020 [employer].

## 2019-07-11 NOTE — Progress Notes (Signed)
Pre visit review using our clinic review tool, if applicable. No additional management support is needed unless otherwise documented below in the visit note. 

## 2019-07-11 NOTE — Progress Notes (Signed)
Subjective:    Patient ID: Natalie Kane, female    DOB: Jul 19, 1980, 39 y.o.   MRN: [id number]  DOS:  07/11/2019 Type of visit - description: Acute She has a long history of episodic bumps of the axillary areas, sometimes they are red and tender. Current episode started 4 days ago, but worse the next day, the area was red, very painful and started draining. She thinks she has hidradenitis suppurativa Denies any problems with her breasts, self breast exam normal.  Review of Systems Denies fever chills  Past Medical History:  Diagnosis Date  . Chicken pox   . Fibroids    microscopic  . Headache(784.0)   . Hypertriglyceridemia 12/05/2015  . Pregnancy induced hypertension     Past Surgical History:  Procedure Laterality Date  . CESAREAN SECTION  03/13/2011   Procedure: CESAREAN SECTION;  Surgeon: Shon Millet II;  Location: [facility];  Service: Gynecology;  Laterality: N/A;  . CHOLECYSTECTOMY N/A 10/17/2015   Procedure: LAPAROSCOPIC CHOLECYSTECTOMY;  Surgeon: Arta Bruce Kinsinger, MD;  Location: [facility];  Service: General;  Laterality: N/A;    Allergies as of 07/11/2019      Reactions   Hydrocodone-homatropine    vomitting      Medication List       Accurate as of July 11, 2019 11:59 PM. If you have any questions, ask your nurse or doctor.        STOP taking these medications   rizatriptan 10 MG tablet Commonly known as: Maxalt Stopped by: Dudley Major, DO     TAKE these medications   Blisovi 24 Fe 1-20 MG-MCG(24) tablet Generic drug: Norethindrone Acetate-Ethinyl Estrad-FE Take 1 tablet by mouth daily.   doxycycline 100 MG tablet Commonly known as: VIBRA-TABS Take 1 tablet (100 mg total) by mouth 2 (two) times daily. Started by: Kathlene November, MD   fluconazole 150 MG tablet Commonly known as: DIFLUCAN Take 1 tablet (150 mg total) by mouth once for 1 dose. May repeat 2nd time if needed What changed:   how much to take  how to take this  when to take  this  additional instructions Changed by: Kathlene November, MD   gabapentin 400 MG capsule Commonly known as: Neurontin Take 1 capsule (400 mg total) by mouth at bedtime.   nortriptyline 25 MG capsule Commonly known as: PAMELOR Take 2 capsules (50 mg total) by mouth at bedtime. What changed: how much to take Changed by: Adam Melvern Sample, DO   Nurtec 75 MG Tbdp Generic drug: Rimegepant Sulfate Take 75 mg by mouth as needed. Started by: Dudley Major, DO   ondansetron 8 MG disintegrating tablet Commonly known as: Zofran ODT Take 1 tablet (8 mg total) by mouth every 8 (eight) hours as needed for nausea or vomiting. Started by: Dudley Major, DO   phenazopyridine 200 MG tablet Commonly known as: Pyridium Take 1 tablet (200 mg total) by mouth 3 (three) times daily as needed for pain.   Vitamin D (Ergocalciferol) 1.25 MG (50000 UNIT) Caps capsule Commonly known as: DRISDOL TAKE 1 CAPSULE (50,000 UNITS TOTAL) BY MOUTH EVERY 7 (SEVEN) DAYS.          Objective:   Physical Exam BP 129/77 (BP Location: Left Arm, Patient Position: Sitting, Cuff Size: Normal)   Pulse 80   Temp (!) 97.3 F (36.3 C) (Temporal)   Resp 16   Ht 6' (1.829 m)   Wt 246 lb (111.6 kg)   LMP 07/03/2019 (Exact  Date)   SpO2 99%   BMI 33.36 kg/m  General:   Well developed, NAD, BMI noted. HEENT:  Normocephalic . Face symmetric, atraumatic  Skin: See picture Left axillary area: Essentially normal Right axillary area: See picture, close to the deltoid muscle, there is an area of fluctuance with a opening, some bloody discharge noted , it is surrounded by induration approximately 3 x 2 inches, + redness. Closer to the breast there is another spot, soft, not tender, not red.  No discharge. Neurologic:  alert & oriented X3.  Speech normal, gait appropriate for age and unassisted Psych--  Cognition and judgment appear intact.  Cooperative with normal attention span and concentration.  Behavior  appropriate. No anxious or depressed appearing.        Assessment     39 year old female, PMH includes migraine headaches, she is on birth control pills.  Presents with  Hidradenitis suppurativa, cellulitis. The patient has on and off boils atn the axillary area, she thinks she has hidradenitis suppurativa, I actually agree with her, this episode has been the largest she had. Plan: Doxycycline x10 days Warm compress Call if fever, chills, redness spreads She already has an appointment to see a dermatologist next week. Advised that if the area is not back to normal ( particularly if she has a residual LAD/nodule) she needs to be seen for a breast exam and possibly mammogram just in case. Addendum: Request Diflucan and a work note for 3 days.  Will do   This visit occurred during the SARS-CoV-2 public health emergency.  Safety protocols were in place, including screening questions prior to the visit, additional usage of staff PPE, and extensive cleaning of exam room while observing appropriate contact time as indicated for disinfecting solutions.

## 2019-07-11 NOTE — Patient Instructions (Signed)
1.  We increased nortriptyline to 50mg  at bedtime.  If headaches not improved in 6 weeks, contact me and we can increase dose. 2.  When you get a migraine, try the Nurtec dissolvable tablet (1 in 24 hours).  If effective, contact me for prescription and use the copay card provided. 3.  Ondansetron refilled 4.  Limit use of pain relievers to no more than 2 days out of week to prevent risk of rebound or medication-overuse headache. 5.  Keep headache diary 6.  Follow up in 4 months.

## 2019-07-13 ENCOUNTER — Encounter: Payer: Self-pay | Admitting: Family Medicine

## 2019-07-13 ENCOUNTER — Telehealth: Payer: Self-pay | Admitting: Neurology

## 2019-07-13 ENCOUNTER — Other Ambulatory Visit: Payer: Self-pay

## 2019-07-13 ENCOUNTER — Ambulatory Visit (INDEPENDENT_AMBULATORY_CARE_PROVIDER_SITE_OTHER): Payer: BC Managed Care – PPO | Admitting: Family Medicine

## 2019-07-13 VITALS — BP 122/84 | HR 67 | Ht 72.0 in | Wt 246.0 lb

## 2019-07-13 DIAGNOSIS — F0781 Postconcussional syndrome: Secondary | ICD-10-CM | POA: Diagnosis not present

## 2019-07-13 DIAGNOSIS — R519 Headache, unspecified: Secondary | ICD-10-CM

## 2019-07-13 DIAGNOSIS — G8929 Other chronic pain: Secondary | ICD-10-CM | POA: Diagnosis not present

## 2019-07-13 MED ORDER — METHYLPREDNISOLONE ACETATE 80 MG/ML IJ SUSP
80.0000 mg | Freq: Once | INTRAMUSCULAR | Status: AC
  Administered 2019-07-13: 80 mg via INTRAMUSCULAR

## 2019-07-13 MED ORDER — KETOROLAC TROMETHAMINE 60 MG/2ML IM SOLN
60.0000 mg | Freq: Once | INTRAMUSCULAR | Status: AC
  Administered 2019-07-13: 60 mg via INTRAMUSCULAR

## 2019-07-13 MED ORDER — OMEGA-3-ACID ETHYL ESTERS 1 G PO CAPS: 1.0000 g | ORAL_CAPSULE | Freq: Two times a day (BID) | ORAL | 0 refills | Status: DC

## 2019-07-13 NOTE — Progress Notes (Signed)
[place] [facility] [place] Phone: [phone] Subjective:   Fontaine No, am serving as a scribe for Dr. Hulan Saas. This visit occurred during the SARS-CoV-2 public health emergency.  Safety protocols were in place, including screening questions prior to the visit, additional usage of staff PPE, and extensive cleaning of exam room while observing appropriate contact time as indicated for disinfecting solutions.   I'm seeing this patient by the request  of:  Debbrah Alar, NP  CC: Postconcussion syndrome follow-up, chronic headaches  [id number]   03/16/2019 39 year old female with postconcussive syndromes now with chronic headaches are severe enough that stops her from daily activities.  I would like patient to be further evaluated by neurology and headache specialist to make sure we are not missing an underlying headache condition that could be contributing.  In addition to this we will have patient come in the office and we will try osteopathic manipulation to see if that will be any beneficial as well  Update 07/13/2019 LACOYA DOBOSZ is a 39 y.o. female coming in with complaint of headaches. Patient states that she has been having approximately 3 headaches a month. Saw Dr. Tomi Likens on Monday. Did a med increase. Headaches are accompanied by nausea. Use to have migraines one every 3-4 months. Can wake up with headache or they could develop later in the day. Has been using gabapentin 400mg .  Increase nortriptyline to 50 mg at bedtime also given abortive therapy.    Past Medical History:  Diagnosis Date  . Chicken pox   . Fibroids    microscopic  . Headache(784.0)   . Hypertriglyceridemia 12/05/2015  . Pregnancy induced hypertension    Past Surgical History:  Procedure Laterality Date  . CESAREAN SECTION  03/13/2011   Procedure: CESAREAN SECTION;  Surgeon: Shon Millet II;  Location: [facility];  Service: Gynecology;   Laterality: N/A;  . CHOLECYSTECTOMY N/A 10/17/2015   Procedure: LAPAROSCOPIC CHOLECYSTECTOMY;  Surgeon: Arta Bruce Kinsinger, MD;  Location: [facility];  Service: General;  Laterality: N/A;   Social History   Socioeconomic History  . Marital status: Divorced    Spouse name: Not on file  . Number of children: 1  . Years of education: Not on file  . Highest education level: Bachelor's degree (e.g., BA, AB, BS)  Occupational History  . Not on file  Tobacco Use  . Smoking status: Never Smoker  . Smokeless tobacco: Never Used  Substance and Sexual Activity  . Alcohol use: No  . Drug use: No  . Sexual activity: Not Currently  Other Topics Concern  . 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    Drinks coffee once a month, tea 3 times a week, soda one a week.    Social Determinants of Health   Financial Resource Strain:   . Difficulty of Paying Living Expenses:   Food Insecurity:   . Worried About Charity fundraiser in the Last Year:   . Arboriculturist in the Last Year:   Transportation Needs:   . Film/video editor (Medical):   Marland Kitchen Lack of Transportation (Non-Medical):   Physical Activity:   . Days of Exercise per Week:   . Minutes of Exercise per Session:   Stress:   . Feeling of Stress :   Social Connections:   .  Frequency of Communication with Friends and Family:   . Frequency of Social Gatherings with Friends and Family:   . Attends Religious Services:   . Active Member of Clubs or Organizations:   . Attends Archivist Meetings:   Marland Kitchen Marital Status:    Allergies  Allergen Reactions  . Hydrocodone-Homatropine     vomitting   Family History  Problem Relation Age of Onset  . Hypertension Father   . Cancer Father        prostate  . Stroke Maternal Grandmother   . Cancer Paternal Grandmother        colon  . Anesthesia problems Neg Hx   .  Hypotension Neg Hx   . Malignant hyperthermia Neg Hx   . Pseudochol deficiency Neg Hx     Current Outpatient Medications (Endocrine & Metabolic):  Marland Kitchen  BLISOVI 24 FE 1-20 MG-MCG(24) tablet, Take 1 tablet by mouth daily.  Current Outpatient Medications (Cardiovascular):  .  omega-3 acid ethyl esters (LOVAZA) 1 g capsule, Take 1 capsule (1 g total) by mouth 2 (two) times daily.   Current Outpatient Medications (Analgesics):  Marland Kitchen  Rimegepant Sulfate (NURTEC) 75 MG TBDP, Take 75 mg by mouth as needed.   Current Outpatient Medications (Other):  .  doxycycline (VIBRA-TABS) 100 MG tablet, Take 1 tablet (100 mg total) by mouth 2 (two) times daily. Marland Kitchen  gabapentin (NEURONTIN) 400 MG capsule, Take 1 capsule (400 mg total) by mouth at bedtime. .  nortriptyline (PAMELOR) 25 MG capsule, Take 2 capsules (50 mg total) by mouth at bedtime. .  ondansetron (ZOFRAN ODT) 8 MG disintegrating tablet, Take 1 tablet (8 mg total) by mouth every 8 (eight) hours as needed for nausea or vomiting. .  phenazopyridine (PYRIDIUM) 200 MG tablet, Take 1 tablet (200 mg total) by mouth 3 (three) times daily as needed for pain. .  Vitamin D, Ergocalciferol, (DRISDOL) 1.25 MG (50000 UNIT) CAPS capsule, TAKE 1 CAPSULE (50,000 UNITS TOTAL) BY MOUTH EVERY 7 (SEVEN) DAYS.   Reviewed prior external information including notes and imaging from  primary care provider As well as notes that were available from care everywhere and other healthcare systems.  Past medical history, social, surgical and family history all reviewed in electronic medical record.  No pertanent information unless stated regarding to the chief complaint.   Review of Systems:  No headache, visual changes, nausea, vomiting, diarrhea, constipation, dizziness, abdominal pain, skin rash, fevers, chills, night sweats, weight loss, swollen lymph nodes, body aches, joint swelling, chest pain, shortness of breath, mood changes. POSITIVE muscle aches  Objective  Blood  pressure 122/84, pulse 67, height 6' (1.829 m), weight 246 lb (111.6 kg), last menstrual period 07/03/2019, SpO2 97 %.   General: No apparent distress alert and oriented x3 mood and affect normal, dressed appropriately.  HEENT: Pupils equal, extraocular movements intact  Respiratory: Patient's speak in full sentences and does not appear short of breath  Cardiovascular: No lower extremity edema, non tender, no erythema  Skin: Warm dry intact with no signs of infection or rash on extremities or on axial skeleton.  Abdomen: Soft nontender  Neuro: Cranial nerves II through XII are intact, neurovascularly intact in all extremities with 2+ DTRs and 2+ pulses.  Lymph: No lymphadenopathy of posterior or anterior cervical chain or axillae bilaterally.  Gait normal with good balance and coordination.  MSK:  Non tender with full range of motion and good stability and symmetric strength and tone of shoulders, elbows, wrist, hip, knee and  ankles bilaterally.  Neck exam does have some mild tightness noted but does have full range of motion.  Negative Spurling's.   Impression and Recommendations:     This case required medical decision making of moderate complexity. The above documentation has been reviewed and is accurate and complete Lyndal Pulley, DO       Note: This dictation was prepared with Dragon dictation along with smaller phrase technology. Any transcriptional errors that result from this process are unintentional.

## 2019-07-13 NOTE — Assessment & Plan Note (Signed)
Patient continues to have some mild chronic symptoms as noted.  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  Patient is being treated no more for the chronic now migraines but continues to have headaches on a regular basis.  Patient is in good hands with her neurologist.  I would like to discuss that with him to see if any repeat imaging would make any differences in treatment.  Patient is having significant no difficulty with his symptoms and given Toradol and Depo-Medrol to help with some of the headaches.  No other significant changes in the other medications at the moment.  Follow-up with me again in 2 to 3 months total time with patient greater than 30 minutes

## 2019-07-13 NOTE — Telephone Encounter (Signed)
Patient was seen on Monday, 07/11/19, and is calling to check on the status of some paperwork she dropped off. She said she was told it would be ready by today for pickup.

## 2019-07-13 NOTE — Telephone Encounter (Signed)
Patient called to check on the status of the request. °

## 2019-07-13 NOTE — Patient Instructions (Signed)
Injections in backside today Try Lovaza twice daily Let me talk to Dr. Tomi Likens Follow up in 4-6 weeks

## 2019-07-14 ENCOUNTER — Telehealth: Payer: Self-pay

## 2019-07-14 NOTE — Telephone Encounter (Signed)
Do you have this anywhere? I do not see a copy of this?

## 2019-07-14 NOTE — Telephone Encounter (Signed)
I filled it out the day of her visit.  I think it was either faxed or mailed to her lawyer.

## 2019-07-14 NOTE — Telephone Encounter (Signed)
Pt called

## 2019-07-15 ENCOUNTER — Encounter: Payer: Self-pay | Admitting: Family Medicine

## 2019-07-15 NOTE — Telephone Encounter (Signed)
Called and spoke with patient. She is aware the forms are ready for her to pick up. Patient appreciative.

## 2019-07-15 NOTE — Telephone Encounter (Signed)
The form was already sent for scanning.  However, I had an extra blank copy which I refilled.  She may pick it up today.

## 2019-07-15 NOTE — Telephone Encounter (Signed)
I gave it to the front staff to fax and scan

## 2019-08-02 ENCOUNTER — Encounter: Payer: Self-pay | Admitting: Family

## 2019-08-03 MED ORDER — IBUPROFEN 800 MG PO TABS: 800.0000 mg | ORAL_TABLET | Freq: Three times a day (TID) | ORAL | 0 refills | Status: DC | PRN

## 2019-08-10 ENCOUNTER — Ambulatory Visit: Payer: BC Managed Care – PPO | Admitting: Family Medicine

## 2019-08-10 DIAGNOSIS — Z0289 Encounter for other administrative examinations: Secondary | ICD-10-CM
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
## 2019-08-17 ENCOUNTER — Other Ambulatory Visit: Payer: Self-pay | Admitting: Family Medicine

## 2019-08-18 ENCOUNTER — Other Ambulatory Visit: Payer: Self-pay | Admitting: Family Medicine

## 2019-08-22 ENCOUNTER — Encounter: Payer: Self-pay | Admitting: Family Medicine

## 2019-08-25 ENCOUNTER — Other Ambulatory Visit: Payer: Self-pay | Admitting: Neurology
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
## 2019-11-11 ENCOUNTER — Ambulatory Visit: Payer: BC Managed Care – PPO | Admitting: Neurology

## 2019-11-25 ENCOUNTER — Encounter: Payer: Self-pay | Admitting: Family Medicine

## 2019-11-27 ENCOUNTER — Encounter: Payer: Self-pay | Admitting: Family

## 2019-11-28 ENCOUNTER — Other Ambulatory Visit: Payer: Self-pay | Admitting: Internal Medicine

## 2019-11-28 MED ORDER — HYDROCORTISONE 2.5 % EX CREA: TOPICAL_CREAM | Freq: Two times a day (BID) | CUTANEOUS | 0 refills | Status: DC

## 2019-11-28 MED ORDER — GABAPENTIN 600 MG PO TABS: 600.0000 mg | ORAL_TABLET | Freq: Two times a day (BID) | ORAL | 3 refills | Status: DC
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
## 2020-02-17 ENCOUNTER — Other Ambulatory Visit: Payer: Self-pay | Admitting: Family

## 2020-02-21 ENCOUNTER — Encounter: Payer: Self-pay | Admitting: Family

## 2020-02-22 ENCOUNTER — Ambulatory Visit: Payer: BC Managed Care – PPO | Admitting: Family

## 2020-02-22 ENCOUNTER — Encounter: Payer: Self-pay | Admitting: Family

## 2020-02-22 ENCOUNTER — Other Ambulatory Visit: Payer: Self-pay

## 2020-02-22 VITALS — BP 153/92 | HR 95 | Temp 98.4°F | Resp 16 | Ht 72.0 in | Wt 257.0 lb

## 2020-02-22 DIAGNOSIS — Z7185 Encounter for immunization safety counseling: Secondary | ICD-10-CM | POA: Diagnosis not present

## 2020-02-22 DIAGNOSIS — R030 Elevated blood-pressure reading, without diagnosis of hypertension: Secondary | ICD-10-CM

## 2020-02-22 DIAGNOSIS — G4700 Insomnia, unspecified: Secondary | ICD-10-CM

## 2020-02-22 MED ORDER — AMITRIPTYLINE HCL 25 MG PO TABS: 25.0000 mg | ORAL_TABLET | Freq: Every day | ORAL | 3 refills | Status: DC
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
## 2020-02-22 NOTE — Patient Instructions (Signed)
Please start elavil 25mg  once daily at bedtime.

## 2020-02-29 ENCOUNTER — Encounter: Payer: Self-pay | Admitting: Family

## 2020-02-29 MED ORDER — AMITRIPTYLINE HCL 50 MG PO TABS: 50.0000 mg | ORAL_TABLET | Freq: Every day | ORAL | 5 refills | Status: DC

## 2020-03-23 ENCOUNTER — Other Ambulatory Visit: Payer: Self-pay | Admitting: Family

## 2020-03-29 ENCOUNTER — Telehealth: Payer: BC Managed Care – PPO | Admitting: Physician Assistant

## 2020-03-29 ENCOUNTER — Encounter: Payer: Self-pay | Admitting: Family

## 2020-03-29 DIAGNOSIS — J069 Acute upper respiratory infection, unspecified: Secondary | ICD-10-CM | POA: Diagnosis not present

## 2020-03-29 MED ORDER — AZELASTINE HCL 0.1 % NA SOLN: 1.0000 | Freq: Two times a day (BID) | NASAL | 12 refills | Status: DC

## 2020-03-29 MED ORDER — BENZONATATE 100 MG PO CAPS: 100.0000 mg | ORAL_CAPSULE | Freq: Three times a day (TID) | ORAL | 1 refills | Status: DC | PRN

## 2020-03-29 NOTE — Progress Notes (Signed)
E-Visit for Corona Virus Screening  Your current symptoms could be consistent with the coronavirus.  Many health care providers can now test patients at their office but not all are.  [facility] has multiple testing sites. For information on our [place] testing locations and hours go to [email or web address]  We are enrolling you in our [place] for [place] . Daily you will receive a questionnaire within the [facility] website. Our COVID 19 response team will be monitoring your responses daily.  Testing Information: The COVID-19 Community Testing sites are testing BY APPOINTMENT ONLY.  You can schedule online at [email or web address]  If you do not have access to a smart phone or computer you may call [phone] for an appointment.   Additional testing sites in the Community:  . For [facility] Testing sites in [facility]  FaceUpdate.[email or web address]  . For Pop-up testing sites in [place]  BowlDirectory.co.uk  . For [facility] [email or web address]  . For [facility] testing in [place] and [employer] [email or web address]  . For [facility] testing in [facility]   [email or web address]  For  more information about community testing call [phone]   Please quarantine yourself while awaiting your test results. Please stay home for a minimum of 10 days from the first day of illness with improving symptoms and you have had 24 hours of no fever (without the use of Tylenol (Acetaminophen) Motrin (Ibuprofen) or any fever reducing medication).  Also - Do not get tested prior to returning to work because once you have had a positive test the test can stay  positive for more than a month in some cases.   You should wear a mask or cloth face covering over your nose and mouth if you must be around other people or animals, including pets (even at home). Try to stay at least 6 feet away from other people. This will protect the people around you.  Please continue good preventive care measures, including:  frequent hand-washing, avoid touching your face, cover coughs/sneezes, stay out of crowds and keep a 6 foot distance from others.  COVID-19 is a respiratory illness with symptoms that are similar to the flu. Symptoms are typically mild to moderate, but there have been cases of severe illness and death due to the virus.   The following symptoms may appear 2-14 days after exposure: . Fever . Cough . Shortness of breath or difficulty breathing . Chills . Repeated shaking with chills . Muscle pain . Headache . Sore throat . New loss of taste or smell . Fatigue . Congestion or runny nose . Nausea or vomiting . Diarrhea  Go to the nearest hospital ED for assessment if fever/cough/breathlessness are severe or illness seems like a threat to life.  It is vitally important that if you feel that you have an infection such as this virus or any other virus that you stay home and away from places where you may spread it to others.  You should avoid contact with people age 107 and older.   You can use medication such as A prescription cough medication called Tessalon Perles 100 mg. You may take 1-2 capsules every 8 hours as needed for cough and A prescription for Azelastine nasal spray 2 sprays in each nostril twice per day  You may also take acetaminophen (Tylenol) as needed for fever.  Reduce your risk of any infection by using the same precautions used for avoiding the common cold or flu:  Marland Kitchen Wash your hands often with soap  and warm water for at least 20 seconds.  If soap and water are not readily available, use an alcohol-based hand sanitizer with at least 60%  alcohol.  . If coughing or sneezing, cover your mouth and nose by coughing or sneezing into the elbow areas of your shirt or coat, into a tissue or into your sleeve (not your hands). . Avoid shaking hands with others and consider head nods or verbal greetings only. . Avoid touching your eyes, nose, or mouth with unwashed hands.  . Avoid close contact with people who are sick. . Avoid places or events with large numbers of people in one location, like concerts or sporting events. . Carefully consider travel plans you have or are making. . If you are planning any travel outside or inside the [place], visit the CDC's Travelers' Health webpage for the latest health notices. . If you have some symptoms but not all symptoms, continue to monitor at home and seek medical attention if your symptoms worsen. . If you are having a medical emergency, call 911.  HOME CARE . Only take medications as instructed by your medical team. . Drink plenty of fluids and get plenty of rest. . A steam or ultrasonic humidifier can help if you have congestion.   GET HELP RIGHT AWAY IF YOU HAVE EMERGENCY WARNING SIGNS** FOR COVID-19. If you or someone is showing any of these signs seek emergency medical care immediately. Call 911 or proceed to your closest emergency facility if: . You develop worsening high fever. . Trouble breathing . Bluish lips or face . Persistent pain or pressure in the chest . New confusion . Inability to wake or stay awake . You cough up blood. . Your symptoms become more severe  **This list is not all possible symptoms. Contact your medical provider for any symptoms that are sever or concerning to you.  MAKE SURE YOU   Understand these instructions.  Will watch your condition.  Will get help right away if you are not doing well or get worse.  Your e-visit answers were reviewed by a board certified advanced clinical practitioner to complete your personal care plan.  Depending on the  condition, your plan could have included both over the counter or prescription medications.  If there is a problem please reply once you have received a response from your provider.  Your safety is important to [place].  If you have drug allergies check your prescription carefully.    You can use MyChart to ask questions about today's visit, request a non-urgent call back, or ask for a work or school excuse for 24 hours related to this e-Visit. If it has been greater than 24 hours you will need to follow up with your provider, or enter a new e-Visit to address those concerns. You will get an e-mail in the next two days asking about your experience.  I hope that your e-visit has been valuable and will speed your recovery. Thank you for using e-visits.  Greater than 5 minutes, yet less than 10 minutes of time have been spent researching, coordinating, and implementing care for this patient today.  Inda Coke PA-C

## 2020-03-30 NOTE — Telephone Encounter (Signed)
Please ask pt to schedule a virtual visit today.

## 2020-04-03 ENCOUNTER — Other Ambulatory Visit: Payer: Self-pay

## 2020-04-03 ENCOUNTER — Telehealth (INDEPENDENT_AMBULATORY_CARE_PROVIDER_SITE_OTHER): Payer: BC Managed Care – PPO | Admitting: Family

## 2020-04-03 VITALS — Temp 98.2°F

## 2020-04-03 DIAGNOSIS — J40 Bronchitis, not specified as acute or chronic: Secondary | ICD-10-CM | POA: Diagnosis not present

## 2020-04-03 DIAGNOSIS — Z20822 Contact with and (suspected) exposure to covid-19: Secondary | ICD-10-CM | POA: Diagnosis not present

## 2020-04-03 MED ORDER — CHERATUSSIN AC 100-10 MG/5ML PO SOLN: 5.0000 mL | Freq: Three times a day (TID) | ORAL | 0 refills | Status: DC | PRN

## 2020-04-03 MED ORDER — FLUCONAZOLE 150 MG PO TABS: ORAL_TABLET | ORAL | 0 refills | Status: DC

## 2020-04-03 MED ORDER — AZITHROMYCIN 250 MG PO TABS: ORAL_TABLET | ORAL | 0 refills | Status: DC

## 2020-04-03 NOTE — Progress Notes (Signed)
Virtual Visit via Video Note  I connected with Natalie Kane on 04/03/20 at  8:40 AM EST by a video enabled telemedicine application and verified that I am speaking with the correct person using two identifiers.  Location: Patient: home Provider: home   I discussed the limitations of evaluation and management by telemedicine and the availability of in person appointments. The patient expressed understanding and agreed to proceed. Only the patient and myself were present for today's video call.   History of Present Illness:  Patient is 39 yr old female who presents today with chief complaint of nasal and chest congestion. Symptoms began on 03/26/20.  Using mucinex.  Cough is productive.  A lot of nasal drainage.  She is seeing blood in the mucous when she blows her nose.  Denies fever, sore throat, myalgia. Started 11/29.  She reports that she did an e-visit on 12/2 and was prescribed tessalon perles and astelin spray. She has noted minimal improvement with these medications.  She is having trouble sleeping due to the cough. Notes some "rumbling" in her chest.  She has not had a covid vaccine and has not been tested for COVID-19.  [facility]   Observations/Objective:   Gen: Awake, alert, no acute distress Resp: Breathing is even and non-labored Psych: calm/pleasant demeanor Neuro: Alert and Oriented x 3, + facial symmetry, speech is clear.   Assessment and Plan:  Bronchitis-  Will rx with azithromycin.  For cough, recommended that she continue mucinex as needed and add cheratussin at bedtime as needed. She believes that she has tolerated codeine in the past without nausea/vomitting.  She requests rx to have on hand for vaginal yeast infection following antibiotics. Rx provided for diflucan.    Follow Up Instructions:    I discussed the assessment and treatment plan with the patient. The patient was provided an opportunity to ask questions  and all were answered. The patient agreed with the plan and demonstrated an understanding of the instructions.   The patient was advised to call back or seek an in-person evaluation if the symptoms worsen or if the condition fails to improve as anticipated.  Nance Pear, NP

## 2020-04-05 DIAGNOSIS — Z20822 Contact with and (suspected) exposure to covid-19: Secondary | ICD-10-CM | POA: Diagnosis not present

## 2020-05-03 ENCOUNTER — Encounter: Payer: Self-pay | Admitting: Family

## 2020-05-03 ENCOUNTER — Other Ambulatory Visit: Payer: Self-pay | Admitting: Family

## 2020-05-03 MED ORDER — AMITRIPTYLINE HCL 50 MG PO TABS: ORAL_TABLET | ORAL | 1 refills | Status: DC

## 2020-06-27 DIAGNOSIS — Z03818 Encounter for observation for suspected exposure to other biological agents ruled out: Secondary | ICD-10-CM | POA: Diagnosis not present

## 2020-06-27 DIAGNOSIS — Z20822 Contact with and (suspected) exposure to covid-19: Secondary | ICD-10-CM | POA: Diagnosis not present

## 2020-09-03 ENCOUNTER — Other Ambulatory Visit: Payer: Self-pay | Admitting: Neurology

## 2020-09-16 ENCOUNTER — Other Ambulatory Visit: Payer: Self-pay | Admitting: Internal Medicine

## 2020-09-17 ENCOUNTER — Encounter: Payer: Self-pay | Admitting: Family

## 2020-09-17 MED ORDER — HYDROCORTISONE 2.5 % EX OINT: TOPICAL_OINTMENT | Freq: Two times a day (BID) | CUTANEOUS | 2 refills | Status: AC | PRN

## 2020-10-01 DIAGNOSIS — T24031D Burn of unspecified degree of right lower leg, subsequent encounter: Secondary | ICD-10-CM | POA: Diagnosis not present

## 2020-10-02 ENCOUNTER — Ambulatory Visit: Payer: BC Managed Care – PPO | Admitting: Medical

## 2020-10-08 DIAGNOSIS — T304 Corrosion of unspecified body region, unspecified degree: Secondary | ICD-10-CM | POA: Diagnosis not present

## 2020-10-08 DIAGNOSIS — T65891A Toxic effect of other specified substances, accidental (unintentional), initial encounter: Secondary | ICD-10-CM | POA: Diagnosis not present

## 2020-10-08 DIAGNOSIS — Y929 Unspecified place or not applicable: Secondary | ICD-10-CM | POA: Diagnosis not present

## 2020-10-08 DIAGNOSIS — T24701D Corrosion of third degree of unspecified site of right lower limb, except ankle and foot, subsequent encounter: Secondary | ICD-10-CM | POA: Diagnosis not present

## 2020-10-08 DIAGNOSIS — T24391A Burn of third degree of multiple sites of right lower limb, except ankle and foot, initial encounter: Secondary | ICD-10-CM | POA: Diagnosis not present

## 2020-10-08 DIAGNOSIS — T310 Burns involving less than 10% of body surface: Secondary | ICD-10-CM | POA: Diagnosis not present

## 2020-10-08 DIAGNOSIS — T24291A Burn of second degree of multiple sites of right lower limb, except ankle and foot, initial encounter: Secondary | ICD-10-CM | POA: Diagnosis not present

## 2020-10-08 DIAGNOSIS — M792 Neuralgia and neuritis, unspecified: Secondary | ICD-10-CM | POA: Diagnosis not present

## 2020-10-08 DIAGNOSIS — L299 Pruritus, unspecified: Secondary | ICD-10-CM | POA: Diagnosis not present

## 2020-10-16 DIAGNOSIS — Z20822 Contact with and (suspected) exposure to covid-19: Secondary | ICD-10-CM | POA: Diagnosis not present

## 2020-10-17 DIAGNOSIS — Z03818 Encounter for observation for suspected exposure to other biological agents ruled out: Secondary | ICD-10-CM | POA: Diagnosis not present

## 2020-10-23 ENCOUNTER — Other Ambulatory Visit: Payer: Self-pay | Admitting: Family

## 2020-10-23 DIAGNOSIS — T320 Corrosions involving less than 10% of body surface: Secondary | ICD-10-CM | POA: Diagnosis not present

## 2020-10-23 DIAGNOSIS — T22792D Corrosion of third degree of multiple sites of left shoulder and upper limb, except wrist and hand, subsequent encounter: Secondary | ICD-10-CM | POA: Diagnosis not present

## 2020-10-23 DIAGNOSIS — T22791D Corrosion of third degree of multiple sites of right shoulder and upper limb, except wrist and hand, subsequent encounter: Secondary | ICD-10-CM | POA: Diagnosis not present

## 2020-10-26 NOTE — Telephone Encounter (Signed)
Pt's blood pressure has been running high. I would like to see her back for a follow up visit please.

## 2020-10-31 NOTE — Telephone Encounter (Signed)
I have called pt to get her scheduled for a follow up visit for her BP since she has not been seen in over a 6 months. There was no answer so I left a message to call back.

## 2020-11-02 NOTE — Telephone Encounter (Signed)
Called pt and left a message to call back once again since there was no answer.

## 2020-11-20 DIAGNOSIS — N939 Abnormal uterine and vaginal bleeding, unspecified: Secondary | ICD-10-CM | POA: Diagnosis not present

## 2020-11-20 DIAGNOSIS — Z6835 Body mass index (BMI) 35.0-35.9, adult: Secondary | ICD-10-CM | POA: Diagnosis not present

## 2020-11-20 DIAGNOSIS — D259 Leiomyoma of uterus, unspecified: Secondary | ICD-10-CM | POA: Diagnosis not present

## 2020-11-20 DIAGNOSIS — L299 Pruritus, unspecified: Secondary | ICD-10-CM | POA: Diagnosis not present

## 2020-11-20 DIAGNOSIS — Z130 Encounter for screening for diseases of the blood and blood-forming organs and certain disorders involving the immune mechanism: Secondary | ICD-10-CM | POA: Diagnosis not present

## 2020-11-20 DIAGNOSIS — Z01419 Encounter for gynecological examination (general) (routine) without abnormal findings: Secondary | ICD-10-CM | POA: Diagnosis not present

## 2020-11-20 DIAGNOSIS — N921 Excessive and frequent menstruation with irregular cycle: Secondary | ICD-10-CM | POA: Diagnosis not present

## 2020-11-20 DIAGNOSIS — D509 Iron deficiency anemia, unspecified: Secondary | ICD-10-CM | POA: Diagnosis not present

## 2020-11-20 DIAGNOSIS — T304 Corrosion of unspecified body region, unspecified degree: Secondary | ICD-10-CM | POA: Diagnosis not present

## 2020-11-20 DIAGNOSIS — T310 Burns involving less than 10% of body surface: Secondary | ICD-10-CM | POA: Diagnosis not present

## 2020-11-20 DIAGNOSIS — T24291D Burn of second degree of multiple sites of right lower limb, except ankle and foot, subsequent encounter: Secondary | ICD-10-CM | POA: Diagnosis not present

## 2020-11-26 NOTE — Telephone Encounter (Signed)
Mychart message sent.

## 2020-12-09 ENCOUNTER — Other Ambulatory Visit: Payer: Self-pay | Admitting: Family

## 2020-12-25 ENCOUNTER — Other Ambulatory Visit: Payer: Self-pay | Admitting: Family

## 2021-01-15 DIAGNOSIS — T320 Corrosions involving less than 10% of body surface: Secondary | ICD-10-CM | POA: Diagnosis not present

## 2021-01-15 DIAGNOSIS — L910 Hypertrophic scar: Secondary | ICD-10-CM | POA: Diagnosis not present

## 2021-01-15 DIAGNOSIS — T24792D Corrosion of third degree of multiple sites of left lower limb, except ankle and foot, subsequent encounter: Secondary | ICD-10-CM | POA: Diagnosis not present

## 2021-01-15 DIAGNOSIS — T24791D Corrosion of third degree of multiple sites of right lower limb, except ankle and foot, subsequent encounter: Secondary | ICD-10-CM | POA: Diagnosis not present

## 2021-01-30 ENCOUNTER — Other Ambulatory Visit: Payer: Self-pay | Admitting: Family

## 2021-02-06 DIAGNOSIS — L905 Scar conditions and fibrosis of skin: Secondary | ICD-10-CM | POA: Diagnosis not present

## 2021-02-06 DIAGNOSIS — L910 Hypertrophic scar: Secondary | ICD-10-CM | POA: Diagnosis not present

## 2021-02-06 DIAGNOSIS — R209 Unspecified disturbances of skin sensation: Secondary | ICD-10-CM | POA: Diagnosis not present

## 2021-02-06 DIAGNOSIS — L299 Pruritus, unspecified: Secondary | ICD-10-CM | POA: Diagnosis not present

## 2021-03-06 DIAGNOSIS — L299 Pruritus, unspecified: Secondary | ICD-10-CM | POA: Diagnosis not present

## 2021-03-06 DIAGNOSIS — R209 Unspecified disturbances of skin sensation: Secondary | ICD-10-CM | POA: Diagnosis not present

## 2021-03-06 DIAGNOSIS — L905 Scar conditions and fibrosis of skin: Secondary | ICD-10-CM | POA: Diagnosis not present

## 2021-03-06 DIAGNOSIS — L910 Hypertrophic scar: Secondary | ICD-10-CM | POA: Diagnosis not present

## 2021-03-09 ENCOUNTER — Other Ambulatory Visit: Payer: Self-pay | Admitting: Family

## 2021-03-12 ENCOUNTER — Other Ambulatory Visit: Payer: Self-pay | Admitting: Family

## 2021-04-07 DIAGNOSIS — S161XXA Strain of muscle, fascia and tendon at neck level, initial encounter: Secondary | ICD-10-CM | POA: Diagnosis not present

## 2021-04-07 DIAGNOSIS — M545 Low back pain, unspecified: Secondary | ICD-10-CM | POA: Diagnosis not present

## 2021-04-07 DIAGNOSIS — S39012A Strain of muscle, fascia and tendon of lower back, initial encounter: Secondary | ICD-10-CM | POA: Diagnosis not present

## 2021-04-07 DIAGNOSIS — M542 Cervicalgia: Secondary | ICD-10-CM | POA: Diagnosis not present

## 2021-04-09 ENCOUNTER — Other Ambulatory Visit: Payer: Self-pay | Admitting: Family

## 2021-04-09 ENCOUNTER — Ambulatory Visit: Payer: BC Managed Care – PPO | Admitting: Family

## 2021-04-09 ENCOUNTER — Encounter: Payer: Self-pay | Admitting: Family

## 2021-04-09 VITALS — BP 140/78 | HR 101 | Temp 98.4°F | Ht 73.0 in | Wt 263.8 lb

## 2021-04-09 DIAGNOSIS — M542 Cervicalgia: Secondary | ICD-10-CM | POA: Diagnosis not present

## 2021-04-09 DIAGNOSIS — R519 Headache, unspecified: Secondary | ICD-10-CM | POA: Diagnosis not present

## 2021-04-09 DIAGNOSIS — M545 Low back pain, unspecified: Secondary | ICD-10-CM | POA: Diagnosis not present

## 2021-04-09 MED ORDER — KETOROLAC TROMETHAMINE 30 MG/ML IJ SOLN
30.0000 mg | Freq: Once | INTRAMUSCULAR | Status: AC
  Administered 2021-04-09: 30 mg via INTRAMUSCULAR

## 2021-04-09 MED ORDER — METHYLPREDNISOLONE ACETATE 40 MG/ML IJ SUSP
40.0000 mg | Freq: Once | INTRAMUSCULAR | Status: AC
  Administered 2021-04-09: 40 mg via INTRAMUSCULAR
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
## 2021-04-17 ENCOUNTER — Other Ambulatory Visit: Payer: Self-pay | Admitting: Family

## 2021-04-17 ENCOUNTER — Encounter: Payer: Self-pay | Admitting: Family

## 2021-04-17 ENCOUNTER — Ambulatory Visit
Admission: RE | Admit: 2021-04-17 | Discharge: 2021-04-17 | Disposition: A | Discharge status: Home / Self Care | Payer: BC Managed Care – PPO | Source: Ambulatory Visit | Attending: Family | Admitting: Family

## 2021-04-17 ENCOUNTER — Other Ambulatory Visit: Payer: BC Managed Care – PPO

## 2021-04-17 ENCOUNTER — Other Ambulatory Visit: Payer: Self-pay

## 2021-04-17 DIAGNOSIS — S060X0A Concussion without loss of consciousness, initial encounter: Secondary | ICD-10-CM

## 2021-04-17 DIAGNOSIS — R519 Headache, unspecified: Secondary | ICD-10-CM

## 2021-04-17 MED ORDER — ONDANSETRON HCL 4 MG PO TABS: 4.0000 mg | ORAL_TABLET | Freq: Three times a day (TID) | ORAL | 0 refills | Status: DC | PRN

## 2021-04-17 NOTE — Telephone Encounter (Signed)
See mychart message between [facility] and Pt.
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
## 2021-04-19 ENCOUNTER — Ambulatory Visit (INDEPENDENT_AMBULATORY_CARE_PROVIDER_SITE_OTHER): Payer: BC Managed Care – PPO | Admitting: Sports Medicine

## 2021-04-19 ENCOUNTER — Other Ambulatory Visit: Payer: Self-pay

## 2021-04-19 VITALS — BP 122/90 | HR 93 | Ht 73.0 in | Wt 263.0 lb

## 2021-04-19 DIAGNOSIS — S060X0A Concussion without loss of consciousness, initial encounter: Secondary | ICD-10-CM

## 2021-04-19 DIAGNOSIS — G44319 Acute post-traumatic headache, not intractable: Secondary | ICD-10-CM

## 2021-04-19 DIAGNOSIS — R112 Nausea with vomiting, unspecified: Secondary | ICD-10-CM | POA: Diagnosis not present

## 2021-04-19 DIAGNOSIS — R270 Ataxia, unspecified: Secondary | ICD-10-CM | POA: Diagnosis not present

## 2021-04-19 NOTE — Patient Instructions (Addendum)
Good to see you  Referral to [facility] physical therapy for vestibular therapy [phone] Take zofran 4-8 mg up to 3 times a day  Alternate Tylenol and Ibuprofen  Work note provided Recommend not driving  1 week follow up
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
## 2021-04-25 ENCOUNTER — Ambulatory Visit (INDEPENDENT_AMBULATORY_CARE_PROVIDER_SITE_OTHER): Payer: BC Managed Care – PPO | Admitting: Sports Medicine

## 2021-04-25 ENCOUNTER — Other Ambulatory Visit: Payer: Self-pay

## 2021-04-25 VITALS — BP 140/80 | HR 72 | Ht 73.0 in | Wt 266.0 lb

## 2021-04-25 DIAGNOSIS — R112 Nausea with vomiting, unspecified: Secondary | ICD-10-CM

## 2021-04-25 DIAGNOSIS — G4700 Insomnia, unspecified: Secondary | ICD-10-CM

## 2021-04-25 DIAGNOSIS — G44319 Acute post-traumatic headache, not intractable: Secondary | ICD-10-CM | POA: Diagnosis not present

## 2021-04-25 DIAGNOSIS — R270 Ataxia, unspecified: Secondary | ICD-10-CM

## 2021-04-25 DIAGNOSIS — S060X0D Concussion without loss of consciousness, subsequent encounter: Secondary | ICD-10-CM | POA: Diagnosis not present

## 2021-04-25 MED ORDER — TRAZODONE HCL 50 MG PO TABS: 50.0000 mg | ORAL_TABLET | Freq: Every evening | ORAL | 0 refills | Status: DC | PRN

## 2021-04-25 NOTE — Patient Instructions (Addendum)
Good to see you  Start vestibular therapy next week Melatonin 5 mg nightly  Trazodone 50 mg nightly if that is ineffective increase to 100 mg  Work note provided  1 week follow up
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
## 2021-05-02 ENCOUNTER — Other Ambulatory Visit: Payer: Self-pay

## 2021-05-02 ENCOUNTER — Other Ambulatory Visit: Payer: Self-pay | Admitting: Family

## 2021-05-02 ENCOUNTER — Ambulatory Visit (INDEPENDENT_AMBULATORY_CARE_PROVIDER_SITE_OTHER): Payer: BC Managed Care – PPO | Admitting: Sports Medicine

## 2021-05-02 VITALS — BP 122/80 | HR 90 | Ht 73.0 in | Wt 257.0 lb

## 2021-05-02 DIAGNOSIS — G4700 Insomnia, unspecified: Secondary | ICD-10-CM

## 2021-05-02 DIAGNOSIS — R112 Nausea with vomiting, unspecified: Secondary | ICD-10-CM | POA: Diagnosis not present

## 2021-05-02 DIAGNOSIS — R270 Ataxia, unspecified: Secondary | ICD-10-CM | POA: Diagnosis not present

## 2021-05-02 DIAGNOSIS — S060X0D Concussion without loss of consciousness, subsequent encounter: Secondary | ICD-10-CM | POA: Diagnosis not present

## 2021-05-02 DIAGNOSIS — R42 Dizziness and giddiness: Secondary | ICD-10-CM | POA: Diagnosis not present

## 2021-05-02 DIAGNOSIS — G44319 Acute post-traumatic headache, not intractable: Secondary | ICD-10-CM

## 2021-05-02 MED ORDER — TRAZODONE HCL 100 MG PO TABS: 100.0000 mg | ORAL_TABLET | Freq: Every day | ORAL | 0 refills | Status: DC

## 2021-05-02 MED ORDER — IBUPROFEN 800 MG PO TABS: 800.0000 mg | ORAL_TABLET | Freq: Three times a day (TID) | ORAL | 0 refills | Status: DC | PRN

## 2021-05-02 NOTE — Patient Instructions (Addendum)
Good to see you  Continue trazodone 100 mg nightly  Restart amitriptyline 50 mg nightly  Start vestibular therapy  Brain MRI 2 week work note given 2 week follow up

## 2021-05-07 ENCOUNTER — Other Ambulatory Visit: Payer: Self-pay | Admitting: Sports Medicine

## 2021-05-09 DIAGNOSIS — S060X0D Concussion without loss of consciousness, subsequent encounter: Secondary | ICD-10-CM | POA: Diagnosis not present

## 2021-05-09 DIAGNOSIS — R42 Dizziness and giddiness: Secondary | ICD-10-CM | POA: Diagnosis not present
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
## 2021-05-15 ENCOUNTER — Other Ambulatory Visit: Payer: Self-pay

## 2021-05-15 ENCOUNTER — Ambulatory Visit (INDEPENDENT_AMBULATORY_CARE_PROVIDER_SITE_OTHER): Payer: BC Managed Care – PPO | Admitting: Sports Medicine

## 2021-05-15 VITALS — BP 120/80 | HR 97 | Ht 73.0 in | Wt 262.0 lb

## 2021-05-15 DIAGNOSIS — R270 Ataxia, unspecified: Secondary | ICD-10-CM

## 2021-05-15 DIAGNOSIS — G4700 Insomnia, unspecified: Secondary | ICD-10-CM

## 2021-05-15 DIAGNOSIS — R110 Nausea: Secondary | ICD-10-CM | POA: Diagnosis not present

## 2021-05-15 DIAGNOSIS — S060X0D Concussion without loss of consciousness, subsequent encounter: Secondary | ICD-10-CM | POA: Diagnosis not present

## 2021-05-15 DIAGNOSIS — G44319 Acute post-traumatic headache, not intractable: Secondary | ICD-10-CM | POA: Diagnosis not present

## 2021-05-15 MED ORDER — TRAZODONE HCL 50 MG PO TABS: 50.0000 mg | ORAL_TABLET | Freq: Every day | ORAL | 0 refills | Status: DC

## 2021-05-15 NOTE — Patient Instructions (Addendum)
Good to see you  Increase trazadone to 150 mg nightly for 4-5 days if no side effects or no improvement in sleep increase to 200 mg nightly  Continue amitriptyline 50 mg  Continue melatonin 5 mg  Work not provided 2 week follow up

## 2021-05-20 DIAGNOSIS — L910 Hypertrophic scar: Secondary | ICD-10-CM | POA: Diagnosis not present

## 2021-05-20 DIAGNOSIS — T304 Corrosion of unspecified body region, unspecified degree: Secondary | ICD-10-CM | POA: Diagnosis not present

## 2021-05-20 DIAGNOSIS — T310 Burns involving less than 10% of body surface: Secondary | ICD-10-CM | POA: Diagnosis not present

## 2021-05-20 DIAGNOSIS — T24291D Burn of second degree of multiple sites of right lower limb, except ankle and foot, subsequent encounter: Secondary | ICD-10-CM | POA: Diagnosis not present

## 2021-05-28 DIAGNOSIS — T304 Corrosion of unspecified body region, unspecified degree: Secondary | ICD-10-CM | POA: Diagnosis not present
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
## 2021-05-30 ENCOUNTER — Ambulatory Visit: Payer: BC Managed Care – PPO | Admitting: Sports Medicine

## 2021-05-30 ENCOUNTER — Other Ambulatory Visit: Payer: Self-pay

## 2021-05-30 VITALS — BP 120/80 | HR 95 | Ht 73.0 in | Wt 261.0 lb

## 2021-05-30 DIAGNOSIS — R110 Nausea: Secondary | ICD-10-CM | POA: Diagnosis not present

## 2021-05-30 DIAGNOSIS — S060X0D Concussion without loss of consciousness, subsequent encounter: Secondary | ICD-10-CM

## 2021-05-30 DIAGNOSIS — G44319 Acute post-traumatic headache, not intractable: Secondary | ICD-10-CM | POA: Diagnosis not present

## 2021-05-30 DIAGNOSIS — G4700 Insomnia, unspecified: Secondary | ICD-10-CM

## 2021-05-30 DIAGNOSIS — R270 Ataxia, unspecified: Secondary | ICD-10-CM

## 2021-05-30 MED ORDER — TRAZODONE HCL 150 MG PO TABS: 150.0000 mg | ORAL_TABLET | Freq: Every evening | ORAL | 0 refills | Status: DC | PRN

## 2021-05-30 NOTE — Patient Instructions (Addendum)
Good to see you Increase trazodone to 150 mg nightly  Continue amitriptyline 50 mg nightly  Continue vestibular therapy  2 week follow up

## 2021-06-03 DIAGNOSIS — T24631S Corrosion of second degree of right lower leg, sequela: Secondary | ICD-10-CM | POA: Diagnosis not present

## 2021-06-03 DIAGNOSIS — L910 Hypertrophic scar: Secondary | ICD-10-CM | POA: Diagnosis not present
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
## 2021-06-13 ENCOUNTER — Ambulatory Visit (INDEPENDENT_AMBULATORY_CARE_PROVIDER_SITE_OTHER): Payer: BC Managed Care – PPO | Admitting: Sports Medicine

## 2021-06-13 ENCOUNTER — Other Ambulatory Visit: Payer: Self-pay

## 2021-06-13 VITALS — BP 118/80 | HR 98 | Ht 73.0 in | Wt 262.0 lb

## 2021-06-13 DIAGNOSIS — S060X0D Concussion without loss of consciousness, subsequent encounter: Secondary | ICD-10-CM | POA: Diagnosis not present

## 2021-06-13 DIAGNOSIS — R270 Ataxia, unspecified: Secondary | ICD-10-CM | POA: Diagnosis not present

## 2021-06-13 DIAGNOSIS — G44319 Acute post-traumatic headache, not intractable: Secondary | ICD-10-CM

## 2021-06-13 DIAGNOSIS — G4700 Insomnia, unspecified: Secondary | ICD-10-CM | POA: Diagnosis not present

## 2021-06-13 MED ORDER — TRAZODONE HCL 150 MG PO TABS: 150.0000 mg | ORAL_TABLET | Freq: Every evening | ORAL | 0 refills | Status: DC | PRN

## 2021-06-13 NOTE — Patient Instructions (Addendum)
Good to see you  Trazodone 150 mg at night refill  Work note provided 2 week follow up

## 2021-06-25 DIAGNOSIS — L918 Other hypertrophic disorders of the skin: Secondary | ICD-10-CM | POA: Diagnosis not present

## 2021-06-25 DIAGNOSIS — L732 Hidradenitis suppurativa: Secondary | ICD-10-CM | POA: Diagnosis not present

## 2021-06-25 DIAGNOSIS — L908 Other atrophic disorders of skin: Secondary | ICD-10-CM | POA: Diagnosis not present
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
## 2021-06-27 ENCOUNTER — Ambulatory Visit (INDEPENDENT_AMBULATORY_CARE_PROVIDER_SITE_OTHER): Payer: BC Managed Care – PPO | Admitting: Sports Medicine

## 2021-06-27 ENCOUNTER — Other Ambulatory Visit: Payer: Self-pay | Admitting: Sports Medicine

## 2021-06-27 ENCOUNTER — Other Ambulatory Visit: Payer: Self-pay

## 2021-06-27 VITALS — BP 122/70 | HR 96 | Ht 73.0 in | Wt 253.0 lb

## 2021-06-27 DIAGNOSIS — G44319 Acute post-traumatic headache, not intractable: Secondary | ICD-10-CM

## 2021-06-27 DIAGNOSIS — S060X0D Concussion without loss of consciousness, subsequent encounter: Secondary | ICD-10-CM | POA: Diagnosis not present

## 2021-06-27 DIAGNOSIS — G4700 Insomnia, unspecified: Secondary | ICD-10-CM

## 2021-06-27 DIAGNOSIS — R270 Ataxia, unspecified: Secondary | ICD-10-CM

## 2021-06-27 NOTE — Patient Instructions (Addendum)
Good to see you  ?Work note  ?Restart melatonin 5 mg at night  ?2 week follow up  ? ?
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
## 2021-07-11 ENCOUNTER — Other Ambulatory Visit: Payer: Self-pay

## 2021-07-11 ENCOUNTER — Ambulatory Visit (INDEPENDENT_AMBULATORY_CARE_PROVIDER_SITE_OTHER): Payer: BC Managed Care – PPO | Admitting: Sports Medicine

## 2021-07-11 VITALS — BP 120/80 | HR 105 | Ht 73.0 in | Wt 253.0 lb

## 2021-07-11 DIAGNOSIS — G44319 Acute post-traumatic headache, not intractable: Secondary | ICD-10-CM

## 2021-07-11 DIAGNOSIS — S060X0D Concussion without loss of consciousness, subsequent encounter: Secondary | ICD-10-CM | POA: Diagnosis not present

## 2021-07-11 DIAGNOSIS — R270 Ataxia, unspecified: Secondary | ICD-10-CM

## 2021-07-11 DIAGNOSIS — G4700 Insomnia, unspecified: Secondary | ICD-10-CM | POA: Diagnosis not present

## 2021-07-11 MED ORDER — TRAZODONE HCL 50 MG PO TABS: 50.0000 mg | ORAL_TABLET | Freq: Every evening | ORAL | 0 refills | Status: DC | PRN

## 2021-07-11 NOTE — Patient Instructions (Addendum)
Good to see you  ?Work note provided  ? Recommend  continue trazodone 100-150 mg nightly  ?Trazodone 50 mg at night  ?Decrease to amitriptyline 25 mg daily for 1 week then amitriptyline 25 mg every other day for 1 week  ?2 week follow up ?

## 2021-07-12 ENCOUNTER — Other Ambulatory Visit: Payer: Self-pay | Admitting: Family
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
## 2021-07-25 ENCOUNTER — Ambulatory Visit (INDEPENDENT_AMBULATORY_CARE_PROVIDER_SITE_OTHER): Payer: BC Managed Care – PPO | Admitting: Sports Medicine

## 2021-07-25 VITALS — BP 122/80 | HR 97 | Ht 73.0 in | Wt 246.0 lb

## 2021-07-25 DIAGNOSIS — G44319 Acute post-traumatic headache, not intractable: Secondary | ICD-10-CM

## 2021-07-25 DIAGNOSIS — G4700 Insomnia, unspecified: Secondary | ICD-10-CM

## 2021-07-25 DIAGNOSIS — S060X0D Concussion without loss of consciousness, subsequent encounter: Secondary | ICD-10-CM | POA: Diagnosis not present

## 2021-07-25 NOTE — Patient Instructions (Addendum)
Good to see you  ?Next 4 days alternate amitriptyline 50 mg and 25 mg  ?4 days after that take amitriptyline 25 mg daily  ?Then 4 days after that alternate amitriptyline  25 mg and no medication  ?Discontinue med's after that  ?Continue Trazodone  50 mg nightly  ?Continue melatonin 5 mg nightly  ?Work note released for full activity no restriction  ?2 week follow up  ?
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
## 2021-08-08 ENCOUNTER — Ambulatory Visit: Payer: BC Managed Care – PPO | Admitting: Sports Medicine

## 2021-08-08 VITALS — BP 122/80 | HR 111 | Ht 73.0 in | Wt 245.0 lb

## 2021-08-08 DIAGNOSIS — S060X0D Concussion without loss of consciousness, subsequent encounter: Secondary | ICD-10-CM

## 2021-08-08 DIAGNOSIS — G4700 Insomnia, unspecified: Secondary | ICD-10-CM | POA: Diagnosis not present

## 2021-08-08 MED ORDER — AMITRIPTYLINE HCL 25 MG PO TABS: 25.0000 mg | ORAL_TABLET | Freq: Every day | ORAL | 0 refills | Status: DC

## 2021-08-08 MED ORDER — TRAZODONE HCL 50 MG PO TABS: 50.0000 mg | ORAL_TABLET | Freq: Every evening | ORAL | 0 refills | Status: DC | PRN

## 2021-08-08 NOTE — Patient Instructions (Addendum)
Good to see you  ?Refill Amitriptyline 25 mg  and trazodone 50 mg nightly  ?Amitriptyline recommend you take alternating days of 25 mg and no medication the following day ? for the next ten days and then complete discontinuation of medicine after that  ?Trazodone recommend taking 50 mg nightly but can increase to 100 mg if having trouble falling asleep  ?Continue melatonin 5 mg nightly  ?You are fully cleared from concussion  ?Follow up with primary care to discuss  insomnia  ?As needed follow up  ? ?

## 2021-08-19 ENCOUNTER — Encounter: Payer: Self-pay | Admitting: Family

## 2021-09-06 ENCOUNTER — Other Ambulatory Visit: Payer: Self-pay | Admitting: Sports Medicine

## 2021-09-13 ENCOUNTER — Other Ambulatory Visit: Payer: Self-pay | Admitting: Family

## 2021-09-30 ENCOUNTER — Telehealth: Payer: BC Managed Care – PPO | Admitting: Physician Assistant

## 2021-09-30 DIAGNOSIS — T753XXA Motion sickness, initial encounter: Secondary | ICD-10-CM

## 2021-09-30 MED ORDER — SCOPOLAMINE 1 MG/3DAYS TD PT72: 1.0000 | MEDICATED_PATCH | TRANSDERMAL | 0 refills | Status: DC

## 2021-09-30 MED ORDER — MECLIZINE HCL 25 MG PO TABS: 25.0000 mg | ORAL_TABLET | Freq: Three times a day (TID) | ORAL | 0 refills | Status: DC | PRN

## 2021-09-30 MED ORDER — ONDANSETRON 4 MG PO TBDP: 4.0000 mg | ORAL_TABLET | Freq: Three times a day (TID) | ORAL | 0 refills | Status: DC | PRN

## 2021-09-30 NOTE — Progress Notes (Signed)
E Visit for Motion Sickness  We are sorry that you are not feeling well. Here is how we plan to help!  Based on what you have shared with me it looks like you have symptoms of motion sickness.  I have prescribed a medication that will help prevent or alleviate your symptoms:  Meclizine '25mg'$  by mouth three times per day as needed for nausea/motion sickness   Prevention:  You might feel better if you keep your eyes focused on outside while you are in motion. For example, if you are in a car, sit in the front and look in the direction you are moving; if you are on a boat, stay on the deck and look to the horizon. This helps make what you see match the movement you are feeling, and so you are less likely to feel sick.  You should also avoid reading, watching a movie, texting or reading messages, or looking at things close to you inside the vehicle you are riding in.  Use the seat head rest. Lean your head against the back of the seat or head rest when traveling in vehicles with seats to minimize head movements.  On a ship: When making your reservations, choose a cabin in the middle of the ship and near the waterline. When on board, go up on deck and focus on the horizon.  In an airplane: Request a window seat and look out the window. A seat over the front edge of the wing is the most preferable spot (the degree of motion is the lowest here). Direct the air vent to blow cool air on your face.  On a train: Always face forward and sit near a window.  In a vehicle: Sit in the front seat; if you are the passenger, look at the scenery in the distance. For some people, driving the vehicle (rather than being a passenger) is an instant remedy.  Avoid others who have become nauseous with motion sickness. Seeing and smelling others who have motion sickness may cause you to become sick.  GET HELP RIGHT AWAY IF:  Your symptoms do not improve or worsen within 2 days after treatment.  You cannot keep  down fluids after trying the medication.  Other associated symptoms such as severe headache, visual field changes, fever, or intractable nausea and vomiting.  MAKE SURE YOU:  Understand these instructions. Will watch your condition. Will get help right away if you are not doing well or get worse.  Thank you for choosing an e-visit.  Your e-visit answers were reviewed by a board certified advanced clinical practitioner to complete your personal care plan. Depending upon the condition, your plan could have included both over the counter or prescription medications.  Please review your pharmacy choice. Be sure that the pharmacy you have chosen is open so that you can pick up your prescription now.  If there is a problem you may message your provider in [place] to have the prescription routed to another pharmacy.  Your safety is important to [place]. If you have drug allergies check your prescription carefully.   For the next 24 hours, you can use MyChart to ask questions about today's visit, request a non-urgent call back, or ask for a work or school excuse from your e-visit provider.  You will get an e-mail in the next two days asking about your experience. I hope that your e-visit has been valuable and will speed your recovery.   References or for more information: ThenWeb.com.ee https://my.Research[employer]s.be https://www.uptodate.com  I provided 5 minutes of non face-to-face time during this encounter for chart review and documentation.

## 2021-09-30 NOTE — Addendum Note (Signed)
Addended by: Mar Daring on: 09/30/2021 04:46 PM   Modules accepted: Orders

## 2021-10-08 ENCOUNTER — Other Ambulatory Visit: Payer: Self-pay | Admitting: Sports Medicine

## 2021-10-10 ENCOUNTER — Other Ambulatory Visit: Payer: Self-pay | Admitting: Sports Medicine

## 2021-11-20 ENCOUNTER — Other Ambulatory Visit: Payer: Self-pay | Admitting: Sports Medicine

## 2021-11-23 ENCOUNTER — Other Ambulatory Visit: Payer: Self-pay | Admitting: Family

## 2021-11-24 ENCOUNTER — Encounter: Payer: Self-pay | Admitting: Family

## 2021-11-26 ENCOUNTER — Other Ambulatory Visit: Payer: Self-pay | Admitting: Family

## 2021-11-26 ENCOUNTER — Ambulatory Visit: Payer: BC Managed Care – PPO | Admitting: Family

## 2021-11-26 VITALS — BP 132/82 | HR 85 | Temp 98.5°F | Resp 16 | Wt 257.0 lb

## 2021-11-26 DIAGNOSIS — E669 Obesity, unspecified: Secondary | ICD-10-CM | POA: Diagnosis not present

## 2021-11-26 DIAGNOSIS — G4700 Insomnia, unspecified: Secondary | ICD-10-CM

## 2021-11-26 MED ORDER — HYDROXYZINE PAMOATE 25 MG PO CAPS: 25.0000 mg | ORAL_CAPSULE | Freq: Every evening | ORAL | 1 refills | Status: DC | PRN

## 2021-11-26 MED ORDER — WEGOVY 0.25 MG/0.5ML ~~LOC~~ SOAJ: 0.2500 mg | SUBCUTANEOUS | 1 refills | Status: DC

## 2021-11-26 NOTE — Assessment & Plan Note (Signed)
Uncontrolled. Stop elavil, trial of HS atarax.

## 2021-11-26 NOTE — Assessment & Plan Note (Signed)
Discussed risks/side effects of Wegovy. She would like to try it. Encouraged proper diet and exercise. Will initiate wegovy and refer to [facility].

## 2021-11-26 NOTE — Patient Instructions (Signed)
Stop amitryptylline, start hydroxyzine. Start wegovy when you are able to get it.

## 2021-11-26 NOTE — Progress Notes (Signed)
Subjective:   By signing my name below, I, Natalie Kane, attest that this documentation has been prepared under the direction and in the presence of [place], NP 11/26/2021    Patient ID: Natalie Kane, female    DOB: 07/04/1980, 41 y.o.   MRN: [id number]  Chief Complaint  Patient presents with   Insomnia    Sleeping well on Elavil 50 mg     HPI Patient is in today for an office visit   Insomnia: She is currently taking 50 Mg of amitriptyline which is not improving her sleep. She was previously taking Trazodone but had poor quality of sleep too.  She denies of any nausea while taking her current medication.  Weight Loss: She is requesting any advice for weight loss or appetite suppression. She drinks sweat tea but majority of the time, consumes water. She has recently joined a gym membership and exercises in the morning. She is currently maintaining a healthy diet. She denies of any family history of thyroid cancer.  Wt Readings from Last 3 Encounters:  11/26/21 257 lb (116.6 kg)  08/08/21 245 lb (111.1 kg)  07/25/21 246 lb (111.6 kg)   Stress: She reports that her stress is improving. She states that what improved her stress was resigning from her previous job.  Supplements: She is currently taking supplements such as Tumeric and Wheatgrass and is inquiring about whether the supplements will interfere with her current medications.   Health Maintenance Due  Topic Date Due   COVID-19 Vaccine (1) Never done   Hepatitis C Screening  Never done   PAP SMEAR-Modifier  12/04/2018   INFLUENZA VACCINE  11/26/2021    Past Medical History:  Diagnosis Date   Chicken pox    Fibroids    microscopic   Headache(784.0)    Hypertriglyceridemia 12/05/2015   Pregnancy induced hypertension     Past Surgical History:  Procedure Laterality Date   CESAREAN SECTION  03/13/2011   Procedure: CESAREAN SECTION;  Surgeon: Shon Millet II;  Location: [facility];  Service: Gynecology;   Laterality: N/A;   CHOLECYSTECTOMY N/A 10/17/2015   Procedure: LAPAROSCOPIC CHOLECYSTECTOMY;  Surgeon: Arta Bruce Kinsinger, MD;  Location: [facility];  Service: General;  Laterality: N/A;    Family History  Problem Relation Age of Onset   Hypertension Father    Cancer Father        prostate   Stroke Maternal Grandmother    Cancer Paternal Grandmother        colon   Anesthesia problems Neg Hx    Hypotension Neg Hx    Malignant hyperthermia Neg Hx    Pseudochol deficiency Neg Hx     Social History   Socioeconomic History   Marital status: Divorced    Spouse name: Not on file   Number of children: 1   Years of education: Not on file   Highest education level: Bachelor's degree (e.g., BA, AB, BS)  Occupational History   Not on file  Tobacco Use   Smoking status: Never   Smokeless tobacco: Never  Vaping Use   Vaping Use: Never used  Substance and Sexual Activity   Alcohol use: No   Drug use: No   Sexual activity: Not Currently  Other Topics Concern   Not on file  Social History Narrative   Divorced    1 child- age 72 (daughter)   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  Drinks coffee once a month, tea 3 times a week, soda one a week.    Social Determinants of Health   Financial Resource Strain: Not on file  Food Insecurity: Not on file  Transportation Needs: Not on file  Physical Activity: Not on file  Stress: Not on file  Social Connections: Not on file  Intimate Partner Violence: Not on file    Outpatient Medications Prior to Visit  Medication Sig Dispense Refill   hydrocortisone 2.5 % ointment Apply topically 2 (two) times daily as needed. 30 g 2   ibuprofen (ADVIL) 800 MG tablet TAKE 1 TABLET BY MOUTH EVERY 8 HOURS AS NEEDED 20 tablet 0   meclizine (ANTIVERT) 25 MG tablet Take 1 tablet (25 mg total) by mouth 3 (three) times daily as needed for dizziness. 30 tablet 0   ondansetron (ZOFRAN) 4 MG tablet  Take 1 tablet (4 mg total) by mouth every 8 (eight) hours as needed for nausea or vomiting. 20 tablet 0   ondansetron (ZOFRAN-ODT) 4 MG disintegrating tablet Take 1 tablet (4 mg total) by mouth every 8 (eight) hours as needed for nausea or vomiting. 20 tablet 0   scopolamine (TRANSDERM-SCOP) 1 MG/3DAYS Place 1 patch (1.5 mg total) onto the skin every 3 (three) days. 10 patch 0   amitriptyline (ELAVIL) 50 MG tablet Take 50 mg by mouth at bedtime.     amitriptyline (ELAVIL) 25 MG tablet TAKE 1 TABLET BY MOUTH EVERYDAY AT BEDTIME 30 tablet 0   azelastine (ASTELIN) 0.1 % nasal spray Place 1 spray into both nostrils 2 (two) times daily. Use in each nostril as directed 30 mL 12   traZODone (DESYREL) 150 MG tablet TAKE 1 TABLET (150 MG TOTAL) BY MOUTH AT BEDTIME AS NEEDED FOR SLEEP. 30 tablet 0   traZODone (DESYREL) 50 MG tablet Take 1 tablet (50 mg total) by mouth at bedtime as needed for sleep. 30 tablet 0   No facility-administered medications prior to visit.    Allergies  Allergen Reactions   Hydrocodone Bit-Homatrop Mbr     vomitting    ROS    See HPI Objective:    Physical Exam Constitutional:      General: She is not in acute distress.    Appearance: Normal appearance. She is not ill-appearing.  HENT:     Head: Normocephalic and atraumatic.     Right Ear: External ear normal.     Left Ear: External ear normal.  Eyes:     Extraocular Movements: Extraocular movements intact.     Pupils: Pupils are equal, round, and reactive to light.  Cardiovascular:     Rate and Rhythm: Normal rate and regular rhythm.     Heart sounds: Normal heart sounds. No murmur heard.    No gallop.  Pulmonary:     Effort: Pulmonary effort is normal. No respiratory distress.     Breath sounds: Normal breath sounds. No wheezing or rales.  Skin:    General: Skin is warm and dry.  Neurological:     Mental Status: She is alert and oriented to person, place, and time.  Psychiatric:        Mood and Affect:  Mood normal.        Behavior: Behavior normal.        Judgment: Judgment normal.     BP 132/82 (BP Location: Right Arm, Patient Position: Sitting, Cuff Size: Large)   Pulse 85   Temp 98.5 F (36.9 C) (Oral)   Resp 16  Wt 257 lb (116.6 kg)   SpO2 100%   BMI 33.91 kg/m  Wt Readings from Last 3 Encounters:  11/26/21 257 lb (116.6 kg)  08/08/21 245 lb (111.1 kg)  07/25/21 246 lb (111.6 kg)       Assessment & Plan:   Problem List Items Addressed This Visit       Unprioritized   Obesity (BMI 30.0-34.9) - Primary    Discussed risks/side effects of Wegovy. She would like to try it. Encouraged proper diet and exercise. Will initiate wegovy and refer to [facility] clinic.       Relevant Orders   Amb Ref to [facility]   Insomnia    Uncontrolled. Stop elavil, trial of HS atarax.       Meds ordered this encounter  Medications   hydrOXYzine (VISTARIL) 25 MG capsule    Sig: Take 1 capsule (25 mg total) by mouth at bedtime as needed (sleep).    Dispense:  30 capsule    Refill:  1    Order Specific Question:   Supervising Provider    Answer:   Penni Homans A [[id number]]   Semaglutide-Weight Management (WEGOVY) 0.25 MG/0.5ML SOAJ    Sig: Inject 0.25 mg into the skin once a week.    Dispense:  2 mL    Refill:  1    Order Specific Question:   Supervising Provider    Answer:   Penni Homans A [[id number]]    I, Natalie Pear, NP, personally preformed the services described in this documentation.  All medical record entries made by the scribe were at my direction and in my presence.  I have reviewed the chart and discharge instructions (if applicable) and agree that the record reflects my personal performance and is accurate and complete. 11/26/2021   I,Natalie Kane,acting as a scribe for Natalie Pear, NP.,have documented all relevant documentation on the behalf of Natalie Pear, NP,as directed by  Natalie Pear, NP while in the  presence of Natalie Pear, NP.    Natalie Pear, NP

## 2021-11-27 ENCOUNTER — Encounter: Payer: Self-pay | Admitting: Family

## 2021-12-06 ENCOUNTER — Encounter: Payer: Self-pay | Admitting: Family

## 2021-12-18 ENCOUNTER — Other Ambulatory Visit: Payer: Self-pay | Admitting: Family

## 2021-12-25 ENCOUNTER — Ambulatory Visit: Payer: BC Managed Care – PPO | Admitting: Family

## 2022-01-01 ENCOUNTER — Telehealth: Payer: Self-pay

## 2022-01-01 ENCOUNTER — Encounter: Payer: Self-pay | Admitting: Family

## 2022-01-01 ENCOUNTER — Ambulatory Visit: Payer: BC Managed Care – PPO | Admitting: Family

## 2022-01-01 ENCOUNTER — Other Ambulatory Visit (HOSPITAL_BASED_OUTPATIENT_CLINIC_OR_DEPARTMENT_OTHER): Payer: Self-pay

## 2022-01-01 DIAGNOSIS — E669 Obesity, unspecified: Secondary | ICD-10-CM | POA: Diagnosis not present

## 2022-01-01 DIAGNOSIS — G4700 Insomnia, unspecified: Secondary | ICD-10-CM | POA: Diagnosis not present

## 2022-01-01 MED ORDER — BELSOMRA 10 MG PO TABS
10.0000 mg | ORAL_TABLET | Freq: Every evening | ORAL | 0 refills | Status: DC | PRN
  Filled 2022-01-01: qty 30, 30d supply, fill #0

## 2022-01-01 MED ORDER — WEGOVY 0.25 MG/0.5ML ~~LOC~~ SOAJ
0.2500 mg | SUBCUTANEOUS | 1 refills | Status: DC
  Filled 2022-01-01: qty 2, 28d supply, fill #0

## 2022-01-01 NOTE — Telephone Encounter (Signed)
PA denied, awaiting denial information.

## 2022-01-01 NOTE — Progress Notes (Signed)
Subjective:   By signing my name below, I, Carylon Perches, attest that this documentation has been prepared under the direction and in the presence of [place], NP 01/01/2022   Patient ID: Natalie Kane, female    DOB: Sep 12, 1980, 41 y.o.   MRN: [id number]  Chief Complaint  Patient presents with   Follow-up   Insomnia    HPI Patient is in today for an office visit   Wegovy: She was not able to receive her Wegovy medication due to limited stock at her pharmacy. She was informed that there might be an increased stock in 09/23.  Insomnia: She states that her 25 mg of Hydroxyzine is not improving her sleep. She also tried taking two tablets which did not help. She has tried Trazadone which gave her bad nightmares. She also tried Ambien which did not improve her sleep. When she does go to bed, her room is dark and she does not use her phone. She also avoids drinking caffeine and consuming desserts before bedtime. She does not eat past 19:00.   Health Maintenance Due  Topic Date Due   COVID-19 Vaccine (1) Never done   Hepatitis C Screening  Never done   PAP SMEAR-Modifier  12/04/2018   INFLUENZA VACCINE  Never done    Past Medical History:  Diagnosis Date   Chicken pox    Fibroids    microscopic   Headache(784.0)    Hypertriglyceridemia 12/05/2015   Pregnancy induced hypertension     Past Surgical History:  Procedure Laterality Date   CESAREAN SECTION  03/13/2011   Procedure: CESAREAN SECTION;  Surgeon: Shon Millet II;  Location: [facility];  Service: Gynecology;  Laterality: N/A;   CHOLECYSTECTOMY N/A 10/17/2015   Procedure: LAPAROSCOPIC CHOLECYSTECTOMY;  Surgeon: Arta Bruce Kinsinger, MD;  Location: [facility];  Service: General;  Laterality: N/A;    Family History  Problem Relation Age of Onset   Hypertension Father    Cancer Father        prostate   Stroke Maternal Grandmother    Cancer Paternal Grandmother        colon   Anesthesia problems Neg Hx     Hypotension Neg Hx    Malignant hyperthermia Neg Hx    Pseudochol deficiency Neg Hx     Social History   Socioeconomic History   Marital status: Divorced    Spouse name: Not on file   Number of children: 1   Years of education: Not on file   Highest education level: Bachelor's degree (e.g., BA, AB, BS)  Occupational History   Not on file  Tobacco Use   Smoking status: Never   Smokeless tobacco: Never  Vaping Use   Vaping Use: Never used  Substance and Sexual Activity   Alcohol use: No   Drug use: No   Sexual activity: Not Currently  Other Topics Concern   Not on file  Social History Narrative   Divorced    1 child- age 29 (daughter)   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    Drinks coffee once a month, tea 3 times a week, soda one a week.    Social Determinants of Health   Financial Resource Strain: Not on file  Food Insecurity: Not on file  Transportation Needs: Not on file  Physical Activity: Not on file  Stress: Not on file  Social Connections: Not on file  Intimate Partner Violence: Not on file  Outpatient Medications Prior to Visit  Medication Sig Dispense Refill   hydrocortisone 2.5 % ointment Apply topically 2 (two) times daily as needed. 30 g 2   hydrOXYzine (VISTARIL) 25 MG capsule TAKE 1 CAPSULE (25 MG TOTAL) BY MOUTH AT BEDTIME AS NEEDED (SLEEP). 90 capsule 1   ibuprofen (ADVIL) 800 MG tablet TAKE 1 TABLET BY MOUTH EVERY 8 HOURS AS NEEDED 20 tablet 0   meclizine (ANTIVERT) 25 MG tablet Take 1 tablet (25 mg total) by mouth 3 (three) times daily as needed for dizziness. 30 tablet 0   ondansetron (ZOFRAN) 4 MG tablet Take 1 tablet (4 mg total) by mouth every 8 (eight) hours as needed for nausea or vomiting. 20 tablet 0   ondansetron (ZOFRAN-ODT) 4 MG disintegrating tablet Take 1 tablet (4 mg total) by mouth every 8 (eight) hours as needed for nausea or vomiting. 20 tablet 0   scopolamine  (TRANSDERM-SCOP) 1 MG/3DAYS Place 1 patch (1.5 mg total) onto the skin every 3 (three) days. 10 patch 0   Semaglutide-Weight Management (WEGOVY) 0.25 MG/0.5ML SOAJ Inject 0.25 mg into the skin once a week. 2 mL 1   No facility-administered medications prior to visit.    Allergies  Allergen Reactions   Hydrocodone Bit-Homatrop Mbr     vomitting    ROS See HPI    Objective:    Physical Exam Constitutional:      General: She is not in acute distress.    Appearance: Normal appearance. She is not ill-appearing.  HENT:     Head: Normocephalic and atraumatic.     Right Ear: External ear normal.     Left Ear: External ear normal.  Eyes:     Extraocular Movements: Extraocular movements intact.     Pupils: Pupils are equal, round, and reactive to light.  Cardiovascular:     Rate and Rhythm: Normal rate and regular rhythm.     Heart sounds: Normal heart sounds. No murmur heard.    No gallop.  Pulmonary:     Effort: Pulmonary effort is normal. No respiratory distress.     Breath sounds: Normal breath sounds. No wheezing or rales.  Skin:    General: Skin is warm and dry.  Neurological:     Mental Status: She is alert and oriented to person, place, and time.  Psychiatric:        Mood and Affect: Mood normal.        Behavior: Behavior normal.        Judgment: Judgment normal.     BP (!) 140/80   Pulse 92   Resp 18   Ht '6\' 1"'$  (1.854 m)   Wt 257 lb (116.6 kg)   LMP 12/25/2021   SpO2 100%   BMI 33.91 kg/m  Wt Readings from Last 3 Encounters:  01/01/22 257 lb (116.6 kg)  11/26/21 257 lb (116.6 kg)  08/08/21 245 lb (111.1 kg)       Assessment & Plan:   Problem List Items Addressed This Visit       Unprioritized   Obesity (BMI 30.0-34.9)    Wt Readings from Last 3 Encounters:  01/01/22 257 lb (116.6 kg)  11/26/21 257 lb (116.6 kg)  08/08/21 245 lb (111.1 kg)  She was unable to start [facility] due to backorder. Will send to a different pharmacy.       Insomnia     No improvement with elavil or hydroxyzine. Will give trial of Belsomra. Reviewed [place] Controlled substance registry and a  controlled substance contract is signed.      Meds ordered this encounter  Medications   Semaglutide-Weight Management (WEGOVY) 0.25 MG/0.5ML SOAJ    Sig: Inject 0.25 mg into the skin once a week.    Dispense:  2 mL    Refill:  1    Order Specific Question:   Supervising Provider    Answer:   Penni Homans A [[id number]]   Suvorexant (BELSOMRA) 10 MG TABS    Sig: Take 10 mg by mouth at bedtime as needed.    Dispense:  30 tablet    Refill:  0    Order Specific Question:   Supervising Provider    Answer:   Penni Homans A [[id number]]    I, Nance Pear, NP, personally preformed the services described in this documentation.  All medical record entries made by the scribe were at my direction and in my presence.  I have reviewed the chart and discharge instructions (if applicable) and agree that the record reflects my personal performance and is accurate and complete. 01/01/2022   I,Amber Collins,acting as a scribe for Nance Pear, NP.,have documented all relevant documentation on the behalf of Nance Pear, NP,as directed by  Nance Pear, NP while in the presence of Nance Pear, NP.    Nance Pear, NP

## 2022-01-01 NOTE — Telephone Encounter (Signed)
PA approved. Effective from 01/01/2022 through 12/31/2022.

## 2022-01-01 NOTE — Assessment & Plan Note (Signed)
Wt Readings from Last 3 Encounters:  01/01/22 257 lb (116.6 kg)  11/26/21 257 lb (116.6 kg)  08/08/21 245 lb (111.1 kg)   She was unable to start [facility] due to backorder. Will send to a different pharmacy.

## 2022-01-01 NOTE — Telephone Encounter (Signed)
PA initiated via Covermymeds;KEY: [id number]. Awaiting determination.

## 2022-01-01 NOTE — Telephone Encounter (Signed)
PA initiated via Covermymeds; KEY: [id number]. Awaiting determination.

## 2022-01-01 NOTE — Assessment & Plan Note (Signed)
No improvement with elavil or hydroxyzine. Will give trial of Belsomra. Reviewed [employer] Controlled substance registry and a controlled substance contract is signed.

## 2022-01-02 ENCOUNTER — Other Ambulatory Visit (HOSPITAL_BASED_OUTPATIENT_CLINIC_OR_DEPARTMENT_OTHER): Payer: Self-pay

## 2022-01-02 NOTE — Addendum Note (Signed)
Addended by: Debbrah Alar on: 01/02/2022 10:48 AM   Modules accepted: Orders

## 2022-01-03 ENCOUNTER — Other Ambulatory Visit (HOSPITAL_BASED_OUTPATIENT_CLINIC_OR_DEPARTMENT_OTHER): Payer: Self-pay

## 2022-01-06 ENCOUNTER — Other Ambulatory Visit (HOSPITAL_BASED_OUTPATIENT_CLINIC_OR_DEPARTMENT_OTHER): Payer: Self-pay

## 2022-01-06 DIAGNOSIS — S39012A Strain of muscle, fascia and tendon of lower back, initial encounter: Secondary | ICD-10-CM | POA: Diagnosis not present

## 2022-01-07 ENCOUNTER — Other Ambulatory Visit (HOSPITAL_BASED_OUTPATIENT_CLINIC_OR_DEPARTMENT_OTHER): Payer: Self-pay

## 2022-01-09 ENCOUNTER — Other Ambulatory Visit (HOSPITAL_BASED_OUTPATIENT_CLINIC_OR_DEPARTMENT_OTHER): Payer: Self-pay

## 2022-01-10 ENCOUNTER — Other Ambulatory Visit (HOSPITAL_BASED_OUTPATIENT_CLINIC_OR_DEPARTMENT_OTHER): Payer: Self-pay

## 2022-01-14 ENCOUNTER — Ambulatory Visit: Payer: BC Managed Care – PPO | Admitting: Bariatrics

## 2022-01-14 ENCOUNTER — Encounter: Payer: Self-pay | Admitting: Bariatrics

## 2022-01-14 VITALS — Ht 73.0 in | Wt 247.0 lb

## 2022-01-14 DIAGNOSIS — Z0289 Encounter for other administrative examinations: Secondary | ICD-10-CM

## 2022-01-14 DIAGNOSIS — E7800 Pure hypercholesterolemia, unspecified: Secondary | ICD-10-CM

## 2022-01-14 DIAGNOSIS — E786 Lipoprotein deficiency: Secondary | ICD-10-CM

## 2022-01-14 DIAGNOSIS — O133 Gestational [pregnancy-induced] hypertension without significant proteinuria, third trimester: Secondary | ICD-10-CM

## 2022-01-14 NOTE — Progress Notes (Signed)
  Office: [phone]  /  Fax: [phone]  New Patient Consultation  Natalie Kane was seen in clinic today to evaluate for obesity. We did a consultation to discuss her options for treatment and educate the patient on her disease state.  Natalie Kane's evaluation and workup began today. She was weighed on the bioimpedance scale and results were discussed and documented in the synopsis. Height '6\' 1"'$  (1.854 m), weight 247 lb (112 kg), last menstrual period 12/25/2021. Body mass index is 32.59 kg/m.   Obesity education preformed today:  We discussed obesity as a disease and the importance of a more detailed evaluation of all the factors contributing to the disease.  We discussed the importance of long term lifestyle changes which include nutrition, exercise and behavioral modifications as well as the importance of customizing this to her specific health and social needs.  We discussed the benefits of reaching a healthier weight to alleviate the symptoms or reduce the risks of biomechanical, metabolic and psychological effects of obesity.  Current health conditions we discussed, and we expect to improve include:  Diagnoses:    Low HDL (under 40)  Elevated cholesterol.  Future labs: lipid panel.   We discussed the goals of this program is to improve her overall health and not simply achieve a specific BMI.  Frequent visits are very important to patient success. I plan to see her every 2 weeks for the first 3 months and then evaluate the visit frequency after that time. I explained obesity is a life-long chronic disease and long term treatments would be required. Medications to help her follow his eating plan may be offered as appropriate but are not required. All medication decisions will be made together after the initial workup is done and benefits and side effects are discussed in depth.  The clinic rules were reviewed including the late policy, cancellation policy, no show  and program fees.  Natalie Kane appears to be in the action stage of change and agrees they are ready to start intensive lifestyle modifications and behavioral modifications. We will schedule a longer visit to continue the evaluation including fasting labs, indirect calorimetry, ECG and a full nutritional and lifestyle evaluation.  30 minutes was spent today on this visit including the above counseling, pre-visit chart review, and post-visit documentation.  Jearld Lesch, DO

## 2022-01-15 ENCOUNTER — Other Ambulatory Visit (HOSPITAL_BASED_OUTPATIENT_CLINIC_OR_DEPARTMENT_OTHER): Payer: Self-pay

## 2022-01-23 DIAGNOSIS — J986 Disorders of diaphragm: Secondary | ICD-10-CM | POA: Diagnosis not present

## 2022-01-23 DIAGNOSIS — R4182 Altered mental status, unspecified: Secondary | ICD-10-CM | POA: Diagnosis not present

## 2022-01-23 DIAGNOSIS — R471 Dysarthria and anarthria: Secondary | ICD-10-CM | POA: Diagnosis not present

## 2022-01-23 DIAGNOSIS — G459 Transient cerebral ischemic attack, unspecified: Secondary | ICD-10-CM | POA: Diagnosis not present

## 2022-01-23 DIAGNOSIS — R413 Other amnesia: Secondary | ICD-10-CM | POA: Diagnosis not present

## 2022-01-23 DIAGNOSIS — R519 Headache, unspecified: Secondary | ICD-10-CM | POA: Diagnosis not present

## 2022-01-24 DIAGNOSIS — R4701 Aphasia: Secondary | ICD-10-CM | POA: Diagnosis not present

## 2022-01-24 DIAGNOSIS — I10 Essential (primary) hypertension: Secondary | ICD-10-CM | POA: Diagnosis not present

## 2022-01-24 DIAGNOSIS — R519 Headache, unspecified: Secondary | ICD-10-CM | POA: Diagnosis not present

## 2022-01-24 DIAGNOSIS — R413 Other amnesia: Secondary | ICD-10-CM | POA: Diagnosis not present

## 2022-01-24 DIAGNOSIS — G459 Transient cerebral ischemic attack, unspecified: Secondary | ICD-10-CM | POA: Diagnosis not present

## 2022-01-24 DIAGNOSIS — Z87820 Personal history of traumatic brain injury: Secondary | ICD-10-CM | POA: Diagnosis not present

## 2022-01-25 DIAGNOSIS — I10 Essential (primary) hypertension: Secondary | ICD-10-CM | POA: Diagnosis not present

## 2022-01-25 DIAGNOSIS — Z87820 Personal history of traumatic brain injury: Secondary | ICD-10-CM | POA: Diagnosis not present

## 2022-01-25 DIAGNOSIS — F430 Acute stress reaction: Secondary | ICD-10-CM | POA: Diagnosis not present

## 2022-01-25 DIAGNOSIS — R4701 Aphasia: Secondary | ICD-10-CM | POA: Diagnosis not present

## 2022-01-31 ENCOUNTER — Inpatient Hospital Stay: Payer: BC Managed Care – PPO | Admitting: Family

## 2022-02-04 DIAGNOSIS — Z3041 Encounter for surveillance of contraceptive pills: Secondary | ICD-10-CM | POA: Diagnosis not present

## 2022-02-04 DIAGNOSIS — Z1231 Encounter for screening mammogram for malignant neoplasm of breast: Secondary | ICD-10-CM | POA: Diagnosis not present

## 2022-02-04 DIAGNOSIS — Z1389 Encounter for screening for other disorder: Secondary | ICD-10-CM | POA: Diagnosis not present

## 2022-02-04 DIAGNOSIS — Z130 Encounter for screening for diseases of the blood and blood-forming organs and certain disorders involving the immune mechanism: Secondary | ICD-10-CM | POA: Diagnosis not present

## 2022-02-04 DIAGNOSIS — Z01419 Encounter for gynecological examination (general) (routine) without abnormal findings: Secondary | ICD-10-CM | POA: Diagnosis not present

## 2022-02-06 ENCOUNTER — Ambulatory Visit: Payer: BC Managed Care – PPO | Admitting: Bariatrics

## 2022-02-07 ENCOUNTER — Ambulatory Visit: Payer: BC Managed Care – PPO | Admitting: Family

## 2022-02-07 ENCOUNTER — Other Ambulatory Visit: Payer: Self-pay | Admitting: Gynecology

## 2022-02-07 DIAGNOSIS — N6489 Other specified disorders of breast: Secondary | ICD-10-CM

## 2022-02-07 DIAGNOSIS — G4700 Insomnia, unspecified: Secondary | ICD-10-CM | POA: Diagnosis not present

## 2022-02-07 DIAGNOSIS — R928 Other abnormal and inconclusive findings on diagnostic imaging of breast: Secondary | ICD-10-CM

## 2022-02-07 MED ORDER — AMITRIPTYLINE HCL 100 MG PO TABS: 100.0000 mg | ORAL_TABLET | Freq: Every day | ORAL | 1 refills | Status: DC

## 2022-02-07 NOTE — Assessment & Plan Note (Signed)
Uncontrolled. Restart elavil '100mg'$  PO HS.  D/c Belsomra.

## 2022-02-07 NOTE — Progress Notes (Signed)
Subjective:     Patient ID: Natalie Kane, female    DOB: [birth year], 41 y.o.   MRN: [id number]  Chief Complaint  Patient presents with   Insomnia    Here for follow up    HPI Patient is in today for follow up.  Insomnia- Reports that belsomra is not helpful for her.  States that elavil 100 mg which she was taking following her concussion was very helpful for her.  She has previously tried trazodone, hydroxyzine and ambien without improvement as well.   Obesity- she was unable to get wegovy due to drug shortages. She is working hard on diet and exercise though and is seeing results.   Wt Readings from Last 3 Encounters:  02/07/22 244 lb (110.7 kg)  01/14/22 247 lb (112 kg)  01/01/22 257 lb (116.6 kg)   Her GYN office requested a callback on her mammogram which she is worried about.   Health Maintenance Due  Topic Date Due   COVID-19 Vaccine (1) Never done   Hepatitis C Screening  Never done   PAP SMEAR-Modifier  12/04/2018   INFLUENZA VACCINE  Never done    Past Medical History:  Diagnosis Date   Chicken pox    Fibroids    microscopic   Headache(784.0)    Hypertriglyceridemia 12/05/2015   Pregnancy induced hypertension     Past Surgical History:  Procedure Laterality Date   CESAREAN SECTION  03/13/2011   Procedure: CESAREAN SECTION;  Surgeon: Shon Millet II;  Location: [facility];  Service: Gynecology;  Laterality: N/A;   CHOLECYSTECTOMY N/A 10/17/2015   Procedure: LAPAROSCOPIC CHOLECYSTECTOMY;  Surgeon: Arta Bruce Kinsinger, MD;  Location: [facility];  Service: General;  Laterality: N/A;    Family History  Problem Relation Age of Onset   Hypertension Father    Cancer Father        prostate   Stroke Maternal Grandmother    Cancer Paternal Grandmother        colon   Anesthesia problems Neg Hx    Hypotension Neg Hx    Malignant hyperthermia Neg Hx    Pseudochol deficiency Neg Hx     Social History   Socioeconomic History   Marital status: Divorced     Spouse name: Not on file   Number of children: 1   Years of education: Not on file   Highest education level: Bachelor's degree (e.g., BA, AB, BS)  Occupational History   Not on file  Tobacco Use   Smoking status: Never   Smokeless tobacco: Never  Vaping Use   Vaping Use: Never used  Substance and Sexual Activity   Alcohol use: No   Drug use: No   Sexual activity: Not Currently  Other Topics Concern   Not on file  Social History Narrative   Divorced    1 child- age 45 (daughter)   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    Drinks coffee once a month, tea 3 times a week, soda one a week.    Social Determinants of Health   Financial Resource Strain: Not on file  Food Insecurity: Not on file  Transportation Needs: Not on file  Physical Activity: Not on file  Stress: Not on file  Social Connections: Not on file  Intimate Partner Violence: Not on file    Outpatient Medications Prior to Visit  Medication Sig Dispense Refill   hydrocortisone 2.5 % ointment Apply topically 2 (two) times  daily as needed. 30 g 2   ibuprofen (ADVIL) 800 MG tablet TAKE 1 TABLET BY MOUTH EVERY 8 HOURS AS NEEDED 20 tablet 0   meclizine (ANTIVERT) 25 MG tablet Take 1 tablet (25 mg total) by mouth 3 (three) times daily as needed for dizziness. 30 tablet 0   ondansetron (ZOFRAN) 4 MG tablet Take 1 tablet (4 mg total) by mouth every 8 (eight) hours as needed for nausea or vomiting. 20 tablet 0   ondansetron (ZOFRAN-ODT) 4 MG disintegrating tablet Take 1 tablet (4 mg total) by mouth every 8 (eight) hours as needed for nausea or vomiting. 20 tablet 0   scopolamine (TRANSDERM-SCOP) 1 MG/3DAYS Place 1 patch (1.5 mg total) onto the skin every 3 (three) days. 10 patch 0   Suvorexant (BELSOMRA) 10 MG TABS Take 10 mg by mouth at bedtime as needed. 30 tablet 0   Semaglutide-Weight Management (WEGOVY) 0.25 MG/0.5ML SOAJ Inject 0.25 mg into the skin once a week.  (Patient not taking: Reported on 02/07/2022) 2 mL 1   No facility-administered medications prior to visit.    Allergies  Allergen Reactions   Hydrocodone Bit-Homatrop Mbr     vomitting    ROS See HPI    Objective:    Physical Exam Constitutional:      General: She is not in acute distress.    Appearance: Normal appearance. She is well-developed.  HENT:     Head: Normocephalic and atraumatic.     Right Ear: External ear normal.     Left Ear: External ear normal.  Eyes:     General: No scleral icterus. Neck:     Thyroid: No thyromegaly.  Cardiovascular:     Rate and Rhythm: Normal rate and regular rhythm.     Heart sounds: Normal heart sounds. No murmur heard. Pulmonary:     Effort: Pulmonary effort is normal. No respiratory distress.     Breath sounds: Normal breath sounds. No wheezing.  Musculoskeletal:     Cervical back: Neck supple.  Skin:    General: Skin is warm and dry.  Neurological:     Mental Status: She is alert and oriented to person, place, and time.  Psychiatric:        Mood and Affect: Mood normal.        Behavior: Behavior normal.        Thought Content: Thought content normal.        Judgment: Judgment normal.     BP 126/73   Pulse 83   Temp 98.2 F (36.8 C) (Oral)   Resp 16   Wt 244 lb (110.7 kg)   SpO2 100%   BMI 32.19 kg/m  Wt Readings from Last 3 Encounters:  02/07/22 244 lb (110.7 kg)  01/14/22 247 lb (112 kg)  01/01/22 257 lb (116.6 kg)       Assessment & Plan:   Problem List Items Addressed This Visit       Unprioritized   Insomnia    Uncontrolled. Restart elavil '100mg'$  PO HS.  D/c Belsomra.        I have discontinued Natalie Georges. Kane's Belsomra. I am also having her start on amitriptyline. Additionally, I am having her maintain her hydrocortisone, ondansetron, ibuprofen, meclizine, scopolamine, ondansetron, and Wegovy.  Meds ordered this encounter  Medications   amitriptyline (ELAVIL) 100 MG tablet    Sig: Take 1  tablet (100 mg total) by mouth at bedtime.    Dispense:  90 tablet    Refill:  1    Order Specific Question:   Supervising Provider    Answer:   Penni Homans A [id number]

## 2022-02-18 ENCOUNTER — Ambulatory Visit: Admission: RE | Admit: 2022-02-18 | Payer: BC Managed Care – PPO | Source: Ambulatory Visit

## 2022-02-18 ENCOUNTER — Ambulatory Visit
Admission: RE | Admit: 2022-02-18 | Discharge: 2022-02-18 | Disposition: A | Discharge status: Home / Self Care | Payer: BC Managed Care – PPO | Source: Ambulatory Visit | Attending: Gynecology | Admitting: Gynecology

## 2022-02-18 DIAGNOSIS — R92331 Mammographic heterogeneous density, right breast: Secondary | ICD-10-CM | POA: Diagnosis not present

## 2022-02-18 DIAGNOSIS — R928 Other abnormal and inconclusive findings on diagnostic imaging of breast: Secondary | ICD-10-CM

## 2022-02-19 ENCOUNTER — Ambulatory Visit: Payer: BC Managed Care – PPO | Admitting: Bariatrics

## 2022-05-12 ENCOUNTER — Encounter: Payer: Self-pay | Admitting: Family

## 2022-05-12 ENCOUNTER — Other Ambulatory Visit: Payer: Self-pay | Admitting: Family

## 2022-05-12 MED ORDER — IBUPROFEN 800 MG PO TABS: 800.0000 mg | ORAL_TABLET | Freq: Three times a day (TID) | ORAL | 0 refills | Status: DC | PRN

## 2022-06-09 ENCOUNTER — Encounter: Payer: Self-pay | Admitting: Family

## 2022-06-09 ENCOUNTER — Telehealth: Payer: No Typology Code available for payment source | Admitting: Physician Assistant

## 2022-06-09 DIAGNOSIS — T3695XA Adverse effect of unspecified systemic antibiotic, initial encounter: Secondary | ICD-10-CM | POA: Diagnosis not present

## 2022-06-09 DIAGNOSIS — R3989 Other symptoms and signs involving the genitourinary system: Secondary | ICD-10-CM | POA: Diagnosis not present

## 2022-06-09 DIAGNOSIS — B379 Candidiasis, unspecified: Secondary | ICD-10-CM | POA: Diagnosis not present

## 2022-06-09 MED ORDER — FLUCONAZOLE 150 MG PO TABS: 150.0000 mg | ORAL_TABLET | ORAL | 0 refills | Status: DC | PRN

## 2022-06-09 MED ORDER — NITROFURANTOIN MONOHYD MACRO 100 MG PO CAPS: 100.0000 mg | ORAL_CAPSULE | Freq: Two times a day (BID) | ORAL | 0 refills | Status: DC

## 2022-06-09 NOTE — Telephone Encounter (Signed)
This was sent in for her already. Thank you.  -JB

## 2022-06-09 NOTE — Progress Notes (Signed)
E-Visit for Urinary Problems  We are sorry that you are not feeling well.  Here is how we plan to help!  Based on what you shared with me it looks like you most likely have a simple urinary tract infection.  A UTI (Urinary Tract Infection) is a bacterial infection of the bladder.  Most cases of urinary tract infections are simple to treat but a key part of your care is to encourage you to drink plenty of fluids and watch your symptoms carefully.  I have prescribed MacroBid 100 mg twice a day for 5 days.  I have also prescribed Fluconazole for antibiotic induced yeast infection. Your symptoms should gradually improve. Call us if the burning in your urine worsens, you develop worsening fever, back pain or pelvic pain or if your symptoms do not resolve after completing the antibiotic.  Urinary tract infections can be prevented by drinking plenty of water to keep your body hydrated.  Also be sure when you wipe, wipe from front to back and don't hold it in!  If possible, empty your bladder every 4 hours.  HOME CARE Drink plenty of fluids Compete the full course of the antibiotics even if the symptoms resolve Remember, when you need to go.go. Holding in your urine can increase the likelihood of getting a UTI! GET HELP RIGHT AWAY IF: You cannot urinate You get a high fever Worsening back pain occurs You see blood in your urine You feel sick to your stomach or throw up You feel like you are going to pass out  MAKE SURE YOU  Understand these instructions. Will watch your condition. Will get help right away if you are not doing well or get worse.   Thank you for choosing an e-visit.  Your e-visit answers were reviewed by a board certified advanced clinical practitioner to complete your personal care plan. Depending upon the condition, your plan could have included both over the counter or prescription medications.  Please review your pharmacy choice. Make sure the pharmacy is open so you  can pick up prescription now. If there is a problem, you may contact your provider through [employer] and have the prescription routed to another pharmacy.  Your safety is important to [place]. If you have drug allergies check your prescription carefully.   For the next 24 hours you can use MyChart to ask questions about today's visit, request a non-urgent call back, or ask for a work or school excuse. You will get an email in the next two days asking about your experience. I hope that your e-visit has been valuable and will speed your recovery.  I have spent 5 minutes in review of e-visit questionnaire, review and updating patient chart, medical decision making and response to patient.   Mar Daring, PA-C

## 2022-07-29 ENCOUNTER — Telehealth: Payer: No Typology Code available for payment source | Admitting: Physician Assistant

## 2022-07-29 DIAGNOSIS — M545 Low back pain, unspecified: Secondary | ICD-10-CM

## 2022-07-29 MED ORDER — METHOCARBAMOL 500 MG PO TABS: 500.0000 mg | ORAL_TABLET | Freq: Three times a day (TID) | ORAL | 0 refills | Status: DC | PRN

## 2022-07-29 MED ORDER — NAPROXEN 500 MG PO TABS: 500.0000 mg | ORAL_TABLET | Freq: Two times a day (BID) | ORAL | 0 refills | Status: DC

## 2022-07-29 NOTE — Progress Notes (Signed)
I have spent 5 minutes in review of e-visit questionnaire, review and updating patient chart, medical decision making and response to patient.   Chasitie Passey Cody Sheketa Ende, PA-C    

## 2022-07-29 NOTE — Progress Notes (Signed)

## 2022-08-04 ENCOUNTER — Ambulatory Visit: Payer: BC Managed Care – PPO | Admitting: Family

## 2022-08-10 ENCOUNTER — Other Ambulatory Visit: Payer: Self-pay | Admitting: Family

## 2022-08-18 ENCOUNTER — Telehealth: Payer: No Typology Code available for payment source | Admitting: Physician Assistant

## 2022-08-18 DIAGNOSIS — T3695XA Adverse effect of unspecified systemic antibiotic, initial encounter: Secondary | ICD-10-CM

## 2022-08-18 DIAGNOSIS — B379 Candidiasis, unspecified: Secondary | ICD-10-CM

## 2022-08-18 DIAGNOSIS — B9689 Other specified bacterial agents as the cause of diseases classified elsewhere: Secondary | ICD-10-CM | POA: Diagnosis not present

## 2022-08-18 DIAGNOSIS — N760 Acute vaginitis: Secondary | ICD-10-CM

## 2022-08-18 MED ORDER — FLUCONAZOLE 150 MG PO TABS: 150.0000 mg | ORAL_TABLET | ORAL | 0 refills | Status: DC | PRN

## 2022-08-18 MED ORDER — METRONIDAZOLE 500 MG PO TABS: 500.0000 mg | ORAL_TABLET | Freq: Two times a day (BID) | ORAL | 0 refills | Status: AC

## 2022-08-18 NOTE — Progress Notes (Signed)
E-Visit for Vaginal Symptoms  We are sorry that you are not feeling well. Here is how we plan to help! Based on what you shared with me it looks like you: May have a vaginosis due to bacteria  Vaginosis is an inflammation of the vagina that can result in discharge, itching and pain. The cause is usually a change in the normal balance of vaginal bacteria or an infection. Vaginosis can also result from reduced estrogen levels after menopause.  The most common causes of vaginosis are:   Bacterial vaginosis which results from an overgrowth of one on several organisms that are normally present in your vagina.   Yeast infections which are caused by a naturally occurring fungus called candida.   Vaginal atrophy (atrophic vaginosis) which results from the thinning of the vagina from reduced estrogen levels after menopause.   Trichomoniasis which is caused by a parasite and is commonly transmitted by sexual intercourse.  Factors that increase your risk of developing vaginosis include: Medications, such as antibiotics and steroids Uncontrolled diabetes Use of hygiene products such as bubble bath, vaginal spray or vaginal deodorant Douching Wearing damp or tight-fitting clothing Using an intrauterine device (IUD) for birth control Hormonal changes, such as those associated with pregnancy, birth control pills or menopause Sexual activity Having a sexually transmitted infection  Your treatment plan is Metronidazole or Flagyl  twice a day for 7 days.  I have electronically sent this prescription into the pharmacy that you have chosen. I have also prescribed Fluconazole as requested for antibiotic induced yeast infection.   Be sure to take all of the medication as directed. Stop taking any medication if you develop a rash, tongue swelling or shortness of breath. Mothers who are breast feeding should consider pumping and discarding their breast milk while on these antibiotics. However, there is no  consensus that infant exposure at these doses would be harmful.  Remember that medication creams can weaken latex condoms. Marland Kitchen   HOME CARE:  Good hygiene may prevent some types of vaginosis from recurring and may relieve some symptoms:  Avoid baths, hot tubs and whirlpool spas. Rinse soap from your outer genital area after a shower, and dry the area well to prevent irritation. Don't use scented or harsh soaps, such as those with deodorant or antibacterial action. Avoid irritants. These include scented tampons and pads. Wipe from front to back after using the toilet. Doing so avoids spreading fecal bacteria to your vagina.  Other things that may help prevent vaginosis include:  Don't douche. Your vagina doesn't require cleansing other than normal bathing. Repetitive douching disrupts the normal organisms that reside in the vagina and can actually increase your risk of vaginal infection. Douching won't clear up a vaginal infection. Use a latex condom. Both female and female latex condoms may help you avoid infections spread by sexual contact. Wear cotton underwear. Also wear pantyhose with a cotton crotch. If you feel comfortable without it, skip wearing underwear to bed. Yeast thrives in [employer] Your symptoms should improve in the next day or two.  GET HELP RIGHT AWAY IF:  You have pain in your lower abdomen ( pelvic area or over your ovaries) You develop nausea or vomiting You develop a fever Your discharge changes or worsens You have persistent pain with intercourse You develop shortness of breath, a rapid pulse, or you faint.  These symptoms could be signs of problems or infections that need to be evaluated by a medical provider now.  MAKE SURE YOU  Understand these instructions. Will watch your condition. Will get help right away if you are not doing well or get worse.  Thank you for choosing an e-visit.  Your e-visit answers were reviewed by a board certified  advanced clinical practitioner to complete your personal care plan. Depending upon the condition, your plan could have included both over the counter or prescription medications.  Please review your pharmacy choice. Make sure the pharmacy is open so you can pick up prescription now. If there is a problem, you may contact your provider through [employer] and have the prescription routed to another pharmacy.  Your safety is important to [place]. If you have drug allergies check your prescription carefully.   For the next 24 hours you can use MyChart to ask questions about today's visit, request a non-urgent call back, or ask for a work or school excuse. You will get an email in the next two days asking about your experience. I hope that your e-visit has been valuable and will speed your recovery.  I have spent 5 minutes in review of e-visit questionnaire, review and updating patient chart, medical decision making and response to patient.   Mar Daring, PA-C

## 2022-09-06 ENCOUNTER — Telehealth: Payer: No Typology Code available for payment source | Admitting: Nurse Practitioner

## 2022-09-06 DIAGNOSIS — R399 Unspecified symptoms and signs involving the genitourinary system: Secondary | ICD-10-CM | POA: Diagnosis not present

## 2022-09-06 MED ORDER — CEPHALEXIN 500 MG PO CAPS: 500.0000 mg | ORAL_CAPSULE | Freq: Two times a day (BID) | ORAL | 0 refills | Status: AC

## 2022-09-06 NOTE — Progress Notes (Signed)
E-Visit for Urinary Problems If your symptoms return within a few months you will need to be seen in person for lab testing of urine and a vaginal swab if you are sexually active. I would also advise against using vaginal sprays.  We are sorry that you are not feeling well.  Here is how we plan to help!  Based on what you shared with me it looks like you most likely have a simple urinary tract infection.  A UTI (Urinary Tract Infection) is a bacterial infection of the bladder.  Most cases of urinary tract infections are simple to treat but a key part of your care is to encourage you to drink plenty of fluids and watch your symptoms carefully.  I have prescribed Keflex 500 mg twice a day for 7 days.  Your symptoms should gradually improve. Call us if the burning in your urine worsens, you develop worsening fever, back pain or pelvic pain or if your symptoms do not resolve after completing the antibiotic.  Urinary tract infections can be prevented by drinking plenty of water to keep your body hydrated.  Also be sure when you wipe, wipe from front to back and don't hold it in!  If possible, empty your bladder every 4 hours.  HOME CARE Drink plenty of fluids Compete the full course of the antibiotics even if the symptoms resolve Remember, when you need to go.go. Holding in your urine can increase the likelihood of getting a UTI! GET HELP RIGHT AWAY IF: You cannot urinate You get a high fever Worsening back pain occurs You see blood in your urine You feel sick to your stomach or throw up You feel like you are going to pass out  MAKE SURE YOU  Understand these instructions. Will watch your condition. Will get help right away if you are not doing well or get worse.   Thank you for choosing an e-visit.  Your e-visit answers were reviewed by a board certified advanced clinical practitioner to complete your personal care plan. Depending upon the condition, your plan could have included both  over the counter or prescription medications.  Please review your pharmacy choice. Make sure the pharmacy is open so you can pick up prescription now. If there is a problem, you may contact your provider through [employer] and have the prescription routed to another pharmacy.  Your safety is important to [place]. If you have drug allergies check your prescription carefully.   For the next 24 hours you can use MyChart to ask questions about today's visit, request a non-urgent call back, or ask for a work or school excuse. You will get an email in the next two days asking about your experience. I hope that your e-visit has been valuable and will speed your recovery.

## 2022-09-06 NOTE — Progress Notes (Signed)
I have spent 5 minutes in review of e-visit questionnaire, review and updating patient chart, medical decision making and response to patient.  ° °Sascha Palma W Aleea Hendry, NP ° °  °

## 2022-10-18 ENCOUNTER — Telehealth: Payer: No Typology Code available for payment source | Admitting: Nurse Practitioner

## 2022-10-18 DIAGNOSIS — T753XXA Motion sickness, initial encounter: Secondary | ICD-10-CM | POA: Diagnosis not present

## 2022-10-18 MED ORDER — SCOPOLAMINE 1 MG/3DAYS TD PT72: 1.0000 | MEDICATED_PATCH | TRANSDERMAL | 0 refills | Status: AC

## 2022-10-18 MED ORDER — ONDANSETRON 4 MG PO TBDP: 4.0000 mg | ORAL_TABLET | Freq: Three times a day (TID) | ORAL | 0 refills | Status: AC | PRN

## 2022-10-18 NOTE — Progress Notes (Signed)
E-Visit for Nausea and Vomiting   We are sorry that you are not feeling well. Here is how we plan to help!  Based on what you have shared with me it looks like you have a Virus that is irritating your GI tract.  Vomiting is the forceful emptying of a portion of the stomach's content through the mouth.  Although nausea and vomiting can make you feel miserable, it's important to remember that these are not diseases, but rather symptoms of an underlying illness.  When we treat short term symptoms, we always caution that any symptoms that persist should be fully evaluated in a medical office.  I have prescribed a medication that will help alleviate your symptoms and allow you to stay hydrated:  Zofran 4 mg 1 tablet every 8 hours as needed for nausea and vomiting  HOME CARE: Drink clear liquids.  This is very important! Dehydration (the lack of fluid) can lead to a serious complication.  Start off with 1 tablespoon every 5 minutes for 8 hours. You may begin eating bland foods after 8 hours without vomiting.  Start with saltine crackers, white bread, rice, mashed potatoes, applesauce. After 48 hours on a bland diet, you may resume a normal diet. Try to go to sleep.  Sleep often empties the stomach and relieves the need to vomit.  GET HELP RIGHT AWAY IF:  Your symptoms do not improve or worsen within 2 days after treatment. You have a fever for over 3 days. You cannot keep down fluids after trying the medication.  MAKE SURE YOU:  Understand these instructions. Will watch your condition. Will get help right away if you are not doing well or get worse.    Thank you for choosing an e-visit.  Your e-visit answers were reviewed by a board certified advanced clinical practitioner to complete your personal care plan. Depending upon the condition, your plan could have included both over the counter or prescription medications.  Please review your pharmacy choice. Make sure the pharmacy is open so  you can pick up prescription now. If there is a problem, you may contact your provider through MyChart messaging and have the prescription routed to another pharmacy.  Your safety is important to us. If you have drug allergies check your prescription carefully.   For the next 24 hours you can use MyChart to ask questions about today's visit, request a non-urgent call back, or ask for a work or school excuse. You will get an email in the next two days asking about your experience. I hope that your e-visit has been valuable and will speed your recovery. Mary-Margaret Marshay Slates, FNP   5-10 minutes spent reviewing and documenting in chart.  

## 2022-11-13 ENCOUNTER — Telehealth: Payer: No Typology Code available for payment source | Admitting: Physician Assistant

## 2022-11-13 DIAGNOSIS — N760 Acute vaginitis: Secondary | ICD-10-CM | POA: Diagnosis not present

## 2022-11-13 DIAGNOSIS — B9689 Other specified bacterial agents as the cause of diseases classified elsewhere: Secondary | ICD-10-CM

## 2022-11-13 MED ORDER — METRONIDAZOLE 500 MG PO TABS
500.0000 mg | ORAL_TABLET | Freq: Two times a day (BID) | ORAL | 0 refills | Status: AC
Start: 2022-11-13 — End: 2022-11-20

## 2022-11-13 NOTE — Progress Notes (Signed)
E-Visit for Vaginal Symptoms  We are sorry that you are not feeling well. Here is how we plan to help! Based on what you shared with me it looks like you: May have a vaginosis due to bacteria  Vaginosis is an inflammation of the vagina that can result in discharge, itching and pain. The cause is usually a change in the normal balance of vaginal bacteria or an infection. Vaginosis can also result from reduced estrogen levels after menopause.  The most common causes of vaginosis are:   Bacterial vaginosis which results from an overgrowth of one on several organisms that are normally present in your vagina.   Yeast infections which are caused by a naturally occurring fungus called candida.   Vaginal atrophy (atrophic vaginosis) which results from the thinning of the vagina from reduced estrogen levels after menopause.   Trichomoniasis which is caused by a parasite and is commonly transmitted by sexual intercourse.  Factors that increase your risk of developing vaginosis include: Medications, such as antibiotics and steroids Uncontrolled diabetes Use of hygiene products such as bubble bath, vaginal spray or vaginal deodorant Douching Wearing damp or tight-fitting clothing Using an intrauterine device (IUD) for birth control Hormonal changes, such as those associated with pregnancy, birth control pills or menopause Sexual activity Having a sexually transmitted infection  Your treatment plan is Metronidazole or Flagyl 500mg twice a day for 7 days.  I have electronically sent this prescription into the pharmacy that you have chosen.  Be sure to take all of the medication as directed. Stop taking any medication if you develop a rash, tongue swelling or shortness of breath. Mothers who are breast feeding should consider pumping and discarding their breast milk while on these antibiotics. However, there is no consensus that infant exposure at these doses would be harmful.  Remember that  medication creams can weaken latex condoms. .   HOME CARE:  Good hygiene may prevent some types of vaginosis from recurring and may relieve some symptoms:  Avoid baths, hot tubs and whirlpool spas. Rinse soap from your outer genital area after a shower, and dry the area well to prevent irritation. Don't use scented or harsh soaps, such as those with deodorant or antibacterial action. Avoid irritants. These include scented tampons and pads. Wipe from front to back after using the toilet. Doing so avoids spreading fecal bacteria to your vagina.  Other things that may help prevent vaginosis include:  Don't douche. Your vagina doesn't require cleansing other than normal bathing. Repetitive douching disrupts the normal organisms that reside in the vagina and can actually increase your risk of vaginal infection. Douching won't clear up a vaginal infection. Use a latex condom. Both female and female latex condoms may help you avoid infections spread by sexual contact. Wear cotton underwear. Also wear pantyhose with a cotton crotch. If you feel comfortable without it, skip wearing underwear to bed. Yeast thrives in moist environments Your symptoms should improve in the next day or two.  GET HELP RIGHT AWAY IF:  You have pain in your lower abdomen ( pelvic area or over your ovaries) You develop nausea or vomiting You develop a fever Your discharge changes or worsens You have persistent pain with intercourse You develop shortness of breath, a rapid pulse, or you faint.  These symptoms could be signs of problems or infections that need to be evaluated by a medical provider now.  MAKE SURE YOU   Understand these instructions. Will watch your condition. Will get help right   away if you are not doing well or get worse.  Thank you for choosing an e-visit.  Your e-visit answers were reviewed by a board certified advanced clinical practitioner to complete your personal care plan. Depending upon the  condition, your plan could have included both over the counter or prescription medications.  Please review your pharmacy choice. Make sure the pharmacy is open so you can pick up prescription now. If there is a problem, you may contact your provider through MyChart messaging and have the prescription routed to another pharmacy.  Your safety is important to us. If you have drug allergies check your prescription carefully.   For the next 24 hours you can use MyChart to ask questions about today's visit, request a non-urgent call back, or ask for a work or school excuse. You will get an email in the next two days asking about your experience. I hope that your e-visit has been valuable and will speed your recovery.  I have spent 5 minutes in review of e-visit questionnaire, review and updating patient chart, medical decision making and response to patient.   Jennifer M Burnette, PA-C  

## 2022-11-25 ENCOUNTER — Encounter: Payer: Self-pay | Admitting: Family

## 2022-11-25 ENCOUNTER — Other Ambulatory Visit (HOSPITAL_BASED_OUTPATIENT_CLINIC_OR_DEPARTMENT_OTHER): Payer: Self-pay

## 2022-11-25 ENCOUNTER — Telehealth: Payer: Self-pay

## 2022-11-25 MED ORDER — WEGOVY 0.25 MG/0.5ML ~~LOC~~ SOAJ
0.2500 mg | SUBCUTANEOUS | 0 refills | Status: DC
Start: 2022-11-25 — End: 2022-12-02
  Filled 2022-11-25: qty 2, 28d supply, fill #0

## 2022-11-25 NOTE — Telephone Encounter (Signed)
Will need updated OV for weight/bmi to restart [facility] therapy.

## 2022-11-25 NOTE — Telephone Encounter (Signed)
Please advise pt to schedule an appointment

## 2022-11-26 ENCOUNTER — Other Ambulatory Visit (HOSPITAL_COMMUNITY): Payer: Self-pay

## 2022-11-26 NOTE — Telephone Encounter (Signed)
Patient scheduled for Friday 8/02

## 2022-11-28 ENCOUNTER — Ambulatory Visit: Payer: No Typology Code available for payment source | Admitting: Family

## 2022-11-28 NOTE — Telephone Encounter (Signed)
Pt rescheduled

## 2022-12-02 ENCOUNTER — Ambulatory Visit: Payer: No Typology Code available for payment source | Admitting: Family

## 2022-12-02 ENCOUNTER — Other Ambulatory Visit (HOSPITAL_BASED_OUTPATIENT_CLINIC_OR_DEPARTMENT_OTHER): Payer: Self-pay

## 2022-12-02 VITALS — BP 139/83 | HR 96 | Temp 97.8°F | Resp 16 | Ht 73.0 in | Wt 251.2 lb

## 2022-12-02 DIAGNOSIS — E669 Obesity, unspecified: Secondary | ICD-10-CM | POA: Diagnosis not present

## 2022-12-02 MED ORDER — ZEPBOUND 5 MG/0.5ML ~~LOC~~ SOAJ
5.0000 mg | SUBCUTANEOUS | 0 refills | Status: DC
  Filled 2022-12-02 – 2022-12-22 (×4): qty 2, 28d supply, fill #0

## 2022-12-02 NOTE — Progress Notes (Signed)
Subjective:     Patient ID: Natalie Kane, female    DOB: [birth year], 42 y.o.   MRN: [id number]  Chief Complaint  Patient presents with   Obesity    Here for follow up, needs BMI check for wegovy authorization.     HPI  Discussed the use of AI scribe software for clinical note transcription with the patient, who gave verbal consent to proceed.  History of Present Illness              Health Maintenance Due  Topic Date Due   Hepatitis C Screening  Never done   PAP SMEAR-Modifier  12/04/2018   COVID-19 Vaccine (1 - 2023-24 season) Never done   INFLUENZA VACCINE  11/27/2022    Past Medical History:  Diagnosis Date   Chicken pox    Fibroids    microscopic   Headache(784.0)    Hypertriglyceridemia 12/05/2015   Pregnancy induced hypertension     Past Surgical History:  Procedure Laterality Date   CESAREAN SECTION  03/13/2011   Procedure: CESAREAN SECTION;  Surgeon: Roselle Locus II;  Location: [facility];  Service: Gynecology;  Laterality: N/A;   CHOLECYSTECTOMY N/A 10/17/2015   Procedure: LAPAROSCOPIC CHOLECYSTECTOMY;  Surgeon: De Blanch Kinsinger, MD;  Location: [facility];  Service: General;  Laterality: N/A;    Family History  Problem Relation Age of Onset   Hypertension Father    Cancer Father        prostate   Stroke Maternal Grandmother    Cancer Paternal Grandmother        colon   Anesthesia problems Neg Hx    Hypotension Neg Hx    Malignant hyperthermia Neg Hx    Pseudochol deficiency Neg Hx     Social History   Socioeconomic History   Marital status: Divorced    Spouse name: Not on file   Number of children: 1   Years of education: Not on file   Highest education level: Bachelor's degree (e.g., BA, AB, BS)  Occupational History   Not on file  Tobacco Use   Smoking status: Never   Smokeless tobacco: Never  Vaping Use   Vaping status: Never Used  Substance and Sexual Activity   Alcohol use: No   Drug use: No   Sexual activity: Not  Currently  Other Topics Concern   Not on file  Social History Narrative   Divorced    1 child- age 49 (daughter)   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    Drinks coffee once a month, tea 3 times a week, soda one a week.    Social Determinants of Health   Financial Resource Strain: Low Risk  (01/24/2022)   Received from [facility], [facility]   Overall Financial Resource Strain (CARDIA)    Difficulty of Paying Living Expenses: Not hard at all  Food Insecurity: Not on file  Transportation Needs: No Transportation Needs (01/24/2022)   Received from [facility], [facility]   PRAPARE - Transportation    Lack of Transportation (Medical): No    Lack of Transportation (Non-Medical): No  Physical Activity: Not on file  Stress: Stress Concern Present (01/24/2022)   Received [facility] - Occupational Stress Questionnaire    Feeling of Stress : To some extent  Social Connections: Unknown (01/23/2022)   Received from [facility], [facility]   Social Network  Social Network: Not on file  Intimate Partner Violence: Unknown (01/23/2022)   [facility], [facility]   HITS    Physically Hurt: Not on file    Insult or Talk Down To: Not on file    Threaten Physical Harm: Not on file    Scream or Curse: Not on file    Outpatient Medications Prior to Visit  Medication Sig Dispense Refill   amitriptyline (ELAVIL) 100 MG tablet TAKE 1 TABLET BY MOUTH EVERYDAY AT BEDTIME 90 tablet 1   hydrocortisone 2.5 % ointment Apply topically 2 (two) times daily as needed. 30 g 2   ondansetron (ZOFRAN-ODT) 4 MG disintegrating tablet Take 1 tablet (4 mg total) by mouth every 8 (eight) hours as needed for nausea or vomiting. 20 tablet 0   scopolamine (TRANSDERM-SCOP) 1 MG/3DAYS Place 1 patch (1.5 mg total) onto the skin every 3 (three) days. 10 patch 0    Semaglutide-Weight Management (WEGOVY) 0.25 MG/0.5ML SOAJ Inject 0.25 mg into the skin once a week. 2 mL 0   No facility-administered medications prior to visit.    Allergies  Allergen Reactions   Hydrocodone Bit-Homatrop Mbr     vomitting    ROS     Objective:    Physical Exam   BP 139/83 (BP Location: Right Arm, Patient Position: Sitting, Cuff Size: Large)   Pulse 96   Temp 97.8 F (36.6 C) (Oral)   Resp 16   Ht 6\' 1"  (1.854 m)   Wt 251 lb 3.2 oz (113.9 kg)   SpO2 100%   BMI 33.14 kg/m  Wt Readings from Last 3 Encounters:  12/02/22 251 lb 3.2 oz (113.9 kg)  02/07/22 244 lb (110.7 kg)  01/14/22 247 lb (112 kg)       Assessment & Plan:   Problem List Items Addressed This Visit   None   I am having Natalie Kane. Trick maintain her hydrocortisone, amitriptyline, ondansetron, scopolamine, and Wegovy.  No orders of the defined types were placed in this encounter.

## 2022-12-02 NOTE — Progress Notes (Signed)
Subjective:     Patient ID: Natalie Kane, female    DOB: 26-Oct-1980, 42 y.o.   MRN: [id number]  Chief Complaint  Patient presents with   Obesity    Here for follow up, needs BMI check for wegovy authorization.     HPI  Discussed the use of AI scribe software for clinical note transcription with the patient, who gave verbal consent to proceed.  42 year old female comes in to office today for weight loss follow up. Patient started on Wegovy and had her first dose last Tuesday 11/25/2022. She is on the starting dose of 0.25mg . She did pay cash for this medication as she was waiting on insurance approval. She states she had her second shot today. Denies any side effects of nausea, GI upset, vomiting or constipation. She states she is trying to do better with healthy eating and portion controll. She is not eating past 7pm. And walks about 3 miles or more daily with her new dog.       Health Maintenance Due  Topic Date Due   Hepatitis C Screening  Never done   PAP SMEAR-Modifier  12/04/2018   COVID-19 Vaccine (1 - 2023-24 season) Never done   INFLUENZA VACCINE  11/27/2022    Past Medical History:  Diagnosis Date   Chicken pox    Fibroids    microscopic   Headache(784.0)    Hypertriglyceridemia 12/05/2015   Pregnancy induced hypertension     Past Surgical History:  Procedure Laterality Date   CESAREAN SECTION  03/13/2011   Procedure: CESAREAN SECTION;  Surgeon: Roselle Locus II;  Location: [facility];  Service: Gynecology;  Laterality: N/A;   CHOLECYSTECTOMY N/A 10/17/2015   Procedure: LAPAROSCOPIC CHOLECYSTECTOMY;  Surgeon: De Blanch Kinsinger, MD;  Location: [facility];  Service: General;  Laterality: N/A;    Family History  Problem Relation Age of Onset   Hypertension Father    Cancer Father        prostate   Stroke Maternal Grandmother    Cancer Paternal Grandmother        colon   Anesthesia problems Neg Hx    Hypotension Neg Hx    Malignant hyperthermia Neg Hx     Pseudochol deficiency Neg Hx     Social History   Socioeconomic History   Marital status: Divorced    Spouse name: Not on file   Number of children: 1   Years of education: Not on file   Highest education level: Bachelor's degree (e.g., BA, AB, BS)  Occupational History   Not on file  Tobacco Use   Smoking status: Never   Smokeless tobacco: Never  Vaping Use   Vaping status: Never Used  Substance and Sexual Activity   Alcohol use: No   Drug use: No   Sexual activity: Not Currently  Other Topics Concern   Not on file  Social History Narrative   Divorced    1 child- age 61 (daughter)   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    Drinks coffee once a month, tea 3 times a week, soda one a week.    Social Determinants of Health   Financial Resource Strain: Low Risk  (01/24/2022)   Received from [facility], [facility]   Overall Financial Resource Strain (CARDIA)    Difficulty of Paying Living Expenses: Not hard at all  Food Insecurity: Not on file  Transportation Needs: No Transportation Needs (01/24/2022)  Received from [employer], [facility]   [facility] - Transportation    Lack of Transportation (Medical): No    Lack of Transportation (Non-Medical): No  Physical Activity: Not on file  Stress: Stress Concern Present (01/24/2022)   Received from [facility], [facility] - Occupational Stress Questionnaire    Feeling of Stress : To some extent  Social Connections: Unknown (01/23/2022)   Received from [facility], [facility]   Social Network    Social Network: Not on file  Intimate Partner Violence: Unknown (01/23/2022)   Received from [facility], [facility]   HITS    Physically Hurt: Not on file    Insult or Talk Down To: Not on file    Threaten Physical Harm: Not on file    Scream or Curse: Not on file    Outpatient Medications Prior to Visit   Medication Sig Dispense Refill   amitriptyline (ELAVIL) 100 MG tablet TAKE 1 TABLET BY MOUTH EVERYDAY AT BEDTIME 90 tablet 1   hydrocortisone 2.5 % ointment Apply topically 2 (two) times daily as needed. 30 g 2   ondansetron (ZOFRAN-ODT) 4 MG disintegrating tablet Take 1 tablet (4 mg total) by mouth every 8 (eight) hours as needed for nausea or vomiting. 20 tablet 0   scopolamine (TRANSDERM-SCOP) 1 MG/3DAYS Place 1 patch (1.5 mg total) onto the skin every 3 (three) days. 10 patch 0   Semaglutide-Weight Management (WEGOVY) 0.25 MG/0.5ML SOAJ Inject 0.25 mg into the skin once a week. 2 mL 0   No facility-administered medications prior to visit.    Allergies  Allergen Reactions   Hydrocodone Bit-Homatrop Mbr     vomitting    Review of Systems  Constitutional:  Negative for chills.  HENT:  Negative for ear discharge, ear pain and sore throat.   Eyes:  Negative for double vision, pain and discharge.  Respiratory:  Negative for shortness of breath.   Genitourinary:  Negative for urgency.  Musculoskeletal:  Negative for back pain, falls and joint pain.  Neurological:  Negative for dizziness, sensory change, speech change, weakness and headaches.  Psychiatric/Behavioral:  Negative for depression.        Objective:    Physical Exam Constitutional:      Appearance: Normal appearance.  HENT:     Head: Normocephalic.     Nose: Nose normal.     Mouth/Throat:     Mouth: Mucous membranes are moist.  Cardiovascular:     Rate and Rhythm: Normal rate and regular rhythm.     Pulses: Normal pulses.     Heart sounds: Normal heart sounds.  Pulmonary:     Effort: Pulmonary effort is normal.     Breath sounds: Normal breath sounds.  Abdominal:     General: Abdomen is flat.  Skin:    General: Skin is warm.  Neurological:     General: No focal deficit present.     Mental Status: She is alert. Mental status is at baseline.  Psychiatric:        Mood and Affect: Mood normal.         Behavior: Behavior normal.        Thought Content: Thought content normal.        Judgment: Judgment normal.      BP 139/83 (BP Location: Right Arm, Patient Position: Sitting, Cuff Size: Large)   Pulse 96   Temp 97.8 F (36.6 C) (Oral)   Resp 16   Ht  6\' 1"  (1.854 m)   Wt 251 lb 3.2 oz (113.9 kg)   SpO2 100%   BMI 33.14 kg/m  Wt Readings from Last 3 Encounters:  12/02/22 251 lb 3.2 oz (113.9 kg)  02/07/22 244 lb (110.7 kg)  01/14/22 247 lb (112 kg)       Assessment & Plan:   Problem List Items Addressed This Visit       Unprioritized   Obesity (BMI 30.0-34.9) - Primary    Will submit rx to her insurance for Zepbound in place of Wegovy as there is better weight loss associated with Zepbound and they also have a coupon which will bring the cost down to $500. Continue to work on healthy diet and regular exercise.        I have discontinued Landry Dyke. [facility]'s [id number]. I am also having her start on Zepbound. Additionally, I am having her maintain her hydrocortisone, amitriptyline, ondansetron, and scopolamine.  Meds ordered this encounter  Medications   tirzepatide (ZEPBOUND) 5 MG/0.5ML Pen    Sig: Inject 5 mg into the skin once a week.    Dispense:  2 mL    Refill:  0    Order Specific Question:   Supervising Provider    Answer:   Danise Edge A [[id number]]

## 2022-12-02 NOTE — Assessment & Plan Note (Addendum)
Will submit rx to her insurance for Zepbound in place of Wegovy as there is better weight loss associated with Zepbound and they also have a coupon which will bring the cost down to $500. Continue to work on healthy diet and regular exercise.

## 2022-12-03 ENCOUNTER — Other Ambulatory Visit (HOSPITAL_BASED_OUTPATIENT_CLINIC_OR_DEPARTMENT_OTHER): Payer: Self-pay

## 2022-12-08 ENCOUNTER — Other Ambulatory Visit (HOSPITAL_BASED_OUTPATIENT_CLINIC_OR_DEPARTMENT_OTHER): Payer: Self-pay

## 2022-12-09 ENCOUNTER — Other Ambulatory Visit (HOSPITAL_BASED_OUTPATIENT_CLINIC_OR_DEPARTMENT_OTHER): Payer: Self-pay

## 2022-12-11 ENCOUNTER — Other Ambulatory Visit (HOSPITAL_BASED_OUTPATIENT_CLINIC_OR_DEPARTMENT_OTHER): Payer: Self-pay

## 2022-12-11 ENCOUNTER — Other Ambulatory Visit (HOSPITAL_COMMUNITY): Payer: Self-pay

## 2022-12-11 NOTE — Telephone Encounter (Signed)
Per test claim for [facility], medication is not covered, plan/benefit exclusion, no PA submitted at this time    Per test claim for Zepbound, medication is not covered, plan/benefit exclusion, no PA submitted at this time

## 2022-12-11 NOTE — Telephone Encounter (Signed)
Called but no answer, left detail message for patient to be aware of this information and to call back if she has any questions

## 2022-12-15 ENCOUNTER — Encounter: Payer: Self-pay | Admitting: Family

## 2022-12-15 ENCOUNTER — Other Ambulatory Visit (HOSPITAL_BASED_OUTPATIENT_CLINIC_OR_DEPARTMENT_OTHER): Payer: Self-pay

## 2022-12-16 ENCOUNTER — Other Ambulatory Visit: Payer: Self-pay | Admitting: Family

## 2022-12-22 ENCOUNTER — Encounter (HOSPITAL_BASED_OUTPATIENT_CLINIC_OR_DEPARTMENT_OTHER): Payer: Self-pay

## 2022-12-22 ENCOUNTER — Other Ambulatory Visit (HOSPITAL_BASED_OUTPATIENT_CLINIC_OR_DEPARTMENT_OTHER): Payer: Self-pay

## 2023-01-13 ENCOUNTER — Other Ambulatory Visit (HOSPITAL_BASED_OUTPATIENT_CLINIC_OR_DEPARTMENT_OTHER): Payer: Self-pay

## 2023-01-13 MED ORDER — ZEPBOUND 7.5 MG/0.5ML ~~LOC~~ SOAJ
7.5000 mg | SUBCUTANEOUS | 2 refills | Status: DC
  Filled 2023-01-13: qty 2, 28d supply, fill #0

## 2023-01-13 NOTE — Addendum Note (Signed)
Addended by: Sandford Craze on: 01/13/2023 02:46 PM   Modules accepted: Orders

## 2023-01-20 ENCOUNTER — Other Ambulatory Visit (HOSPITAL_BASED_OUTPATIENT_CLINIC_OR_DEPARTMENT_OTHER): Payer: Self-pay

## 2023-02-13 ENCOUNTER — Other Ambulatory Visit: Payer: Self-pay | Admitting: Family

## 2023-02-13 ENCOUNTER — Other Ambulatory Visit (HOSPITAL_BASED_OUTPATIENT_CLINIC_OR_DEPARTMENT_OTHER): Payer: Self-pay

## 2023-02-13 ENCOUNTER — Encounter: Payer: Self-pay | Admitting: Family

## 2023-02-13 MED ORDER — ZEPBOUND 10 MG/0.5ML ~~LOC~~ SOAJ
10.0000 mg | SUBCUTANEOUS | 1 refills | Status: DC
  Filled 2023-02-13: qty 2, 28d supply, fill #0

## 2023-02-28 ENCOUNTER — Telehealth: Payer: No Typology Code available for payment source | Admitting: Family

## 2023-02-28 DIAGNOSIS — N760 Acute vaginitis: Secondary | ICD-10-CM

## 2023-02-28 DIAGNOSIS — B9689 Other specified bacterial agents as the cause of diseases classified elsewhere: Secondary | ICD-10-CM

## 2023-02-28 MED ORDER — METRONIDAZOLE 500 MG PO TABS: 500.0000 mg | ORAL_TABLET | Freq: Two times a day (BID) | ORAL | 0 refills | Status: DC

## 2023-02-28 NOTE — Progress Notes (Signed)

## 2023-03-04 ENCOUNTER — Ambulatory Visit: Payer: No Typology Code available for payment source | Admitting: Family

## 2023-03-05 LAB — HM MAMMOGRAPHY

## 2023-03-10 ENCOUNTER — Encounter: Payer: Self-pay | Admitting: Family

## 2023-03-11 ENCOUNTER — Other Ambulatory Visit (HOSPITAL_BASED_OUTPATIENT_CLINIC_OR_DEPARTMENT_OTHER): Payer: Self-pay

## 2023-03-11 MED ORDER — TIRZEPATIDE-WEIGHT MANAGEMENT 12.5 MG/0.5ML ~~LOC~~ SOAJ
12.5000 mg | SUBCUTANEOUS | 1 refills | Status: DC
  Filled 2023-03-11 – 2023-03-16 (×2): qty 2, 28d supply, fill #0
  Filled 2023-04-13: qty 2, 28d supply, fill #1

## 2023-03-16 ENCOUNTER — Other Ambulatory Visit (HOSPITAL_BASED_OUTPATIENT_CLINIC_OR_DEPARTMENT_OTHER): Payer: Self-pay

## 2023-04-12 ENCOUNTER — Other Ambulatory Visit: Payer: Self-pay | Admitting: Family

## 2023-04-12 NOTE — Telephone Encounter (Signed)
Please contact pt to schedule a follow up visit.  

## 2023-04-13 ENCOUNTER — Other Ambulatory Visit (HOSPITAL_BASED_OUTPATIENT_CLINIC_OR_DEPARTMENT_OTHER): Payer: Self-pay

## 2023-04-30 ENCOUNTER — Telehealth: Payer: No Typology Code available for payment source | Admitting: Family Medicine

## 2023-04-30 DIAGNOSIS — R3989 Other symptoms and signs involving the genitourinary system: Secondary | ICD-10-CM | POA: Diagnosis not present

## 2023-04-30 DIAGNOSIS — T3695XA Adverse effect of unspecified systemic antibiotic, initial encounter: Secondary | ICD-10-CM | POA: Diagnosis not present

## 2023-04-30 DIAGNOSIS — B379 Candidiasis, unspecified: Secondary | ICD-10-CM | POA: Diagnosis not present

## 2023-04-30 DIAGNOSIS — R300 Dysuria: Secondary | ICD-10-CM

## 2023-04-30 MED ORDER — FLUCONAZOLE 150 MG PO TABS
150.0000 mg | ORAL_TABLET | ORAL | 0 refills | Status: DC
Start: 2023-04-30 — End: 2024-03-03

## 2023-04-30 MED ORDER — CEPHALEXIN 500 MG PO CAPS
500.0000 mg | ORAL_CAPSULE | Freq: Two times a day (BID) | ORAL | 0 refills | Status: AC
Start: 2023-04-30 — End: 2023-05-07

## 2023-04-30 NOTE — Progress Notes (Signed)
 Wrong EV submitted [place]

## 2023-04-30 NOTE — Progress Notes (Signed)

## 2023-05-13 ENCOUNTER — Other Ambulatory Visit (HOSPITAL_BASED_OUTPATIENT_CLINIC_OR_DEPARTMENT_OTHER): Payer: Self-pay

## 2023-05-13 ENCOUNTER — Other Ambulatory Visit: Payer: Self-pay | Admitting: Family

## 2023-05-13 MED ORDER — ZEPBOUND 12.5 MG/0.5ML ~~LOC~~ SOAJ
12.5000 mg | SUBCUTANEOUS | 1 refills | Status: DC
  Filled 2023-05-13: qty 2, 28d supply, fill #0
  Filled 2023-06-10: qty 2, 28d supply, fill #1

## 2023-05-18 ENCOUNTER — Telehealth: Payer: No Typology Code available for payment source | Admitting: Family Medicine

## 2023-05-18 DIAGNOSIS — R300 Dysuria: Secondary | ICD-10-CM

## 2023-05-18 NOTE — Progress Notes (Signed)
  Because you were treated for this a few weeks ago your condition warrants further evaluation and I recommend that you be seen in a face-to-face visit.  You can visit any local urgent care or your PCP office for this.   NOTE: There will be NO CHARGE for this E-Visit   If you are having a true medical emergency, please call 911.

## 2023-05-29 ENCOUNTER — Telehealth: Payer: No Typology Code available for payment source | Admitting: Physician Assistant

## 2023-05-29 DIAGNOSIS — M5441 Lumbago with sciatica, right side: Secondary | ICD-10-CM | POA: Diagnosis not present

## 2023-05-29 MED ORDER — METHYLPREDNISOLONE 4 MG PO TBPK: ORAL_TABLET | ORAL | 0 refills | Status: DC

## 2023-05-29 MED ORDER — CYCLOBENZAPRINE HCL 10 MG PO TABS: 5.0000 mg | ORAL_TABLET | Freq: Three times a day (TID) | ORAL | 0 refills | Status: AC | PRN

## 2023-05-29 NOTE — Addendum Note (Signed)
Addended by: Georgana Curio on: 05/29/2023 06:36 PM   Modules accepted: Orders

## 2023-05-29 NOTE — Progress Notes (Signed)
 E-Visit for Back Pain   We are sorry that you are not feeling well.  Here is how we plan to help!  Based on what you have shared with me it looks like you mostly have acute back pain.  Acute back pain is defined as musculoskeletal pain that can resolve in 1-3 weeks with conservative treatment.  I have prescribed Medrol dose pack, a steroid anti-inflammatory, as well as Flexeril 10 mg every eight hours as needed which is a muscle relaxer  Some patients experience stomach irritation or in increased heartburn with anti-inflammatory drugs.  Please keep in mind that muscle relaxer's can cause fatigue and should not be taken while at work or driving.  Back pain is very common.  The pain often gets better over time.  The cause of back pain is usually not dangerous.  Most people can learn to manage their back pain on their own.  Home Care Stay active.  Start with short walks on flat ground if you can.  Try to walk farther each day. Do not sit, drive or stand in one place for more than 30 minutes.  Do not stay in bed. Do not avoid exercise or work.  Activity can help your back heal faster. Be careful when you bend or lift an object.  Bend at your knees, keep the object close to you, and do not twist. Sleep on a firm mattress.  Lie on your side, and bend your knees.  If you lie on your back, put a pillow under your knees. Only take medicines as told by your doctor. Put ice on the injured area. Put ice in a plastic bag Place a towel between your skin and the bag Leave the ice on for 15-20 minutes, 3-4 times a day for the first 2-3 days. 210 After that, you can switch between ice and heat packs. Ask your doctor about back exercises or massage. Avoid feeling anxious or stressed.  Find good ways to deal with stress, such as exercise.  Get Help Right Way If: Your pain does not go away with rest or medicine. Your pain does not go away in 1 week. You have new problems. You do not feel well. The pain  spreads into your legs. You cannot control when you poop (bowel movement) or pee (urinate) You feel sick to your stomach (nauseous) or throw up (vomit) You have belly (abdominal) pain. You feel like you may pass out (faint). If you develop a fever.  Make Sure you: Understand these instructions. Will watch your condition Will get help right away if you are not doing well or get worse.  Your e-visit answers were reviewed by a board certified advanced clinical practitioner to complete your personal care plan.  Depending on the condition, your plan could have included both over the counter or prescription medications.  If there is a problem please reply  once you have received a response from your provider.  Your safety is important to [place].  If you have drug allergies check your prescription carefully.    You can use MyChart to ask questions about today's visit, request a non-urgent call back, or ask for a work or school excuse for 24 hours related to this e-Visit. If it has been greater than 24 hours you will need to follow up with your provider, or enter a new e-Visit to address those concerns.  You will get an e-mail in the next two days asking about your experience.  I hope that your  e-visit has been valuable and will speed your recovery. Thank you for using e-visits.  I have spent 5 minutes in review of e-visit questionnaire, review and updating patient chart, medical decision making and response to patient.   Margaretann Loveless, PA-C

## 2023-06-01 ENCOUNTER — Ambulatory Visit: Payer: No Typology Code available for payment source | Admitting: Family

## 2023-06-10 ENCOUNTER — Other Ambulatory Visit (HOSPITAL_BASED_OUTPATIENT_CLINIC_OR_DEPARTMENT_OTHER): Payer: Self-pay

## 2023-06-12 ENCOUNTER — Ambulatory Visit: Payer: No Typology Code available for payment source | Admitting: Family

## 2023-07-10 ENCOUNTER — Other Ambulatory Visit (HOSPITAL_BASED_OUTPATIENT_CLINIC_OR_DEPARTMENT_OTHER): Payer: Self-pay

## 2023-07-10 ENCOUNTER — Other Ambulatory Visit: Payer: Self-pay | Admitting: Family

## 2023-07-10 MED ORDER — ZEPBOUND 12.5 MG/0.5ML ~~LOC~~ SOAJ
12.5000 mg | SUBCUTANEOUS | 1 refills | Status: DC
  Filled 2023-07-10: qty 2, 28d supply, fill #0
  Filled 2023-08-10: qty 2, 28d supply, fill #1

## 2023-07-19 ENCOUNTER — Other Ambulatory Visit: Payer: Self-pay | Admitting: Family

## 2023-07-20 NOTE — Telephone Encounter (Signed)
 Please contact pt to schedule a follow up visit.

## 2023-07-21 NOTE — Telephone Encounter (Signed)
 Lvm 2 sch.

## 2023-08-03 ENCOUNTER — Other Ambulatory Visit: Payer: Self-pay | Admitting: Family

## 2023-08-03 NOTE — Telephone Encounter (Signed)
 Please contact pt to schedule follow up.

## 2023-08-05 ENCOUNTER — Encounter

## 2023-08-05 ENCOUNTER — Telehealth: Admitting: Physician Assistant

## 2023-08-05 DIAGNOSIS — R3989 Other symptoms and signs involving the genitourinary system: Secondary | ICD-10-CM | POA: Diagnosis not present

## 2023-08-05 MED ORDER — SULFAMETHOXAZOLE-TRIMETHOPRIM 800-160 MG PO TABS: 1.0000 | ORAL_TABLET | Freq: Two times a day (BID) | ORAL | 0 refills | Status: DC

## 2023-08-05 NOTE — Progress Notes (Signed)
 I have spent 5 minutes in review of e-visit questionnaire, review and updating patient chart, medical decision making and response to patient.   Piedad Climes, PA-C

## 2023-08-05 NOTE — Progress Notes (Signed)

## 2023-08-10 ENCOUNTER — Other Ambulatory Visit (HOSPITAL_BASED_OUTPATIENT_CLINIC_OR_DEPARTMENT_OTHER): Payer: Self-pay

## 2023-08-14 ENCOUNTER — Telehealth: Admitting: Physician Assistant

## 2023-08-14 DIAGNOSIS — N390 Urinary tract infection, site not specified: Secondary | ICD-10-CM | POA: Diagnosis not present

## 2023-08-14 MED ORDER — CEPHALEXIN 500 MG PO CAPS: 500.0000 mg | ORAL_CAPSULE | Freq: Two times a day (BID) | ORAL | 0 refills | Status: DC

## 2023-08-14 NOTE — Progress Notes (Signed)
 E-Visit for Urinary Problems  We are sorry that you are not feeling well.  Here is how we plan to help!  Based on what you shared with me it looks like you most likely have a simple urinary tract infection.  A UTI (Urinary Tract Infection) is a bacte

## 2023-09-06 ENCOUNTER — Other Ambulatory Visit: Payer: Self-pay | Admitting: Family

## 2023-09-07 ENCOUNTER — Other Ambulatory Visit (HOSPITAL_BASED_OUTPATIENT_CLINIC_OR_DEPARTMENT_OTHER): Payer: Self-pay

## 2023-09-18 ENCOUNTER — Other Ambulatory Visit (HOSPITAL_BASED_OUTPATIENT_CLINIC_OR_DEPARTMENT_OTHER): Payer: Self-pay

## 2023-09-28 ENCOUNTER — Other Ambulatory Visit (HOSPITAL_BASED_OUTPATIENT_CLINIC_OR_DEPARTMENT_OTHER): Payer: Self-pay

## 2023-09-29 ENCOUNTER — Other Ambulatory Visit (HOSPITAL_BASED_OUTPATIENT_CLINIC_OR_DEPARTMENT_OTHER): Payer: Self-pay

## 2023-09-30 ENCOUNTER — Other Ambulatory Visit (HOSPITAL_BASED_OUTPATIENT_CLINIC_OR_DEPARTMENT_OTHER): Payer: Self-pay

## 2023-09-30 ENCOUNTER — Other Ambulatory Visit: Payer: Self-pay | Admitting: Family

## 2023-10-02 ENCOUNTER — Other Ambulatory Visit (HOSPITAL_BASED_OUTPATIENT_CLINIC_OR_DEPARTMENT_OTHER): Payer: Self-pay

## 2023-10-06 ENCOUNTER — Other Ambulatory Visit: Payer: Self-pay | Admitting: Family

## 2023-10-14 ENCOUNTER — Other Ambulatory Visit (HOSPITAL_BASED_OUTPATIENT_CLINIC_OR_DEPARTMENT_OTHER): Payer: Self-pay

## 2023-10-19 ENCOUNTER — Other Ambulatory Visit (HOSPITAL_BASED_OUTPATIENT_CLINIC_OR_DEPARTMENT_OTHER): Payer: Self-pay

## 2023-11-06 ENCOUNTER — Other Ambulatory Visit: Payer: Self-pay | Admitting: Family

## 2023-11-10 ENCOUNTER — Other Ambulatory Visit: Payer: Self-pay | Admitting: Family

## 2023-11-27 ENCOUNTER — Telehealth: Admitting: Physician Assistant

## 2023-11-27 DIAGNOSIS — H53149 Visual discomfort, unspecified: Secondary | ICD-10-CM

## 2023-11-27 NOTE — Progress Notes (Signed)
  Because we need to perform a good eye exam, I feel your condition warrants further evaluation and I recommend that you be seen in a face-to-face visit.   NOTE: There will be NO CHARGE for this E-Visit   If you are having a true medical emergency, please call 911.     For an urgent face to face visit, [place] has multiple urgent care centers for your convenience.  Click the link below for the full list of locations and hours, walk-in wait times, appointment scheduling options and driving directions:  Urgent Care - [place], [place], [place], [place], [place], [place]       Your MyChart E-visit questionnaire answers were reviewed by a board certified advanced clinical practitioner to complete your personal care plan based on your specific symptoms.    Thank you for using e-Visits.

## 2023-12-17 ENCOUNTER — Other Ambulatory Visit: Payer: Self-pay | Admitting: Family

## 2023-12-17 ENCOUNTER — Telehealth: Payer: Self-pay | Admitting: Family

## 2023-12-17 NOTE — Telephone Encounter (Signed)
 Please call patient to schedule appointment.

## 2024-02-29 ENCOUNTER — Telehealth: Admitting: Emergency Medicine

## 2024-02-29 DIAGNOSIS — R3989 Other symptoms and signs involving the genitourinary system: Secondary | ICD-10-CM | POA: Diagnosis not present

## 2024-02-29 MED ORDER — CEPHALEXIN 500 MG PO CAPS: 500.0000 mg | ORAL_CAPSULE | Freq: Two times a day (BID) | ORAL | 0 refills | Status: AC

## 2024-02-29 NOTE — Progress Notes (Signed)

## 2024-03-03 ENCOUNTER — Telehealth: Admitting: Physician Assistant

## 2024-03-03 DIAGNOSIS — B379 Candidiasis, unspecified: Secondary | ICD-10-CM

## 2024-03-03 MED ORDER — FLUCONAZOLE 150 MG PO TABS: 150.0000 mg | ORAL_TABLET | ORAL | 0 refills | Status: AC | PRN

## 2024-03-03 NOTE — Progress Notes (Signed)

## 2024-04-04 ENCOUNTER — Telehealth: Admitting: Nurse Practitioner

## 2024-04-04 DIAGNOSIS — B9689 Other specified bacterial agents as the cause of diseases classified elsewhere: Secondary | ICD-10-CM

## 2024-04-04 MED ORDER — METRONIDAZOLE 500 MG PO TABS: 500.0000 mg | ORAL_TABLET | Freq: Three times a day (TID) | ORAL | 0 refills | Status: AC

## 2024-04-04 NOTE — Progress Notes (Signed)
 We are sorry that you are not feeling well. Here is how we plan to help! Based on what you shared with me it looks like you: May have a vaginosis due to bacteria  Vaginosis is an inflammation of the vagina that can result in discharge, itching and pain. The cause is usually a change in the normal balance of vaginal bacteria or an infection. Vaginosis can also result from reduced estrogen levels after menopause.  The most common causes of vaginosis are:   Bacterial vaginosis which results from an overgrowth of one on several organisms that are normally present in your vagina.   Yeast infections which are caused by a naturally occurring fungus called candida.   Vaginal atrophy (atrophic vaginosis) which results from the thinning of the vagina from reduced estrogen levels after menopause.   Trichomoniasis which is caused by a parasite and is commonly transmitted by sexual intercourse.  Factors that increase your risk of developing vaginosis include: Medications, such as antibiotics and steroids Uncontrolled diabetes Use of hygiene products such as bubble bath, vaginal spray or vaginal deodorant Douching Wearing damp or tight-fitting clothing Using an intrauterine device (IUD) for birth control Hormonal changes, such as those associated with pregnancy, birth control pills or menopause Sexual activity Having a sexually transmitted infection  Your treatment plan is Metronidazole  or Flagyl  500mg  twice a day for 7 days.  I have electronically sent this prescription into the pharmacy that you have chosen.  Be sure to take all of the medication as directed. Stop taking any medication if you develop a rash, tongue swelling or shortness of breath. Mothers who are breast feeding should consider pumping and discarding their breast milk while on these antibiotics. However, there is no consensus that infant exposure at these doses would be harmful.  Remember that medication creams can weaken latex  condoms.   HOME CARE:  Good hygiene may prevent some types of vaginosis from recurring and may relieve some symptoms:  Avoid baths, hot tubs and whirlpool spas. Rinse soap from your outer genital area after a shower, and dry the area well to prevent irritation. Don't use scented or harsh soaps, such as those with deodorant or antibacterial action. Avoid irritants. These include scented tampons and pads. Wipe from front to back after using the toilet. Doing so avoids spreading fecal bacteria to your vagina.  Other things that may help prevent vaginosis include:  Don't douche. Your vagina doesn't require cleansing other than normal bathing. Repetitive douching disrupts the normal organisms that reside in the vagina and can actually increase your risk of vaginal infection. Douching won't clear up a vaginal infection. Use a latex condom. Both female and female latex condoms may help you avoid infections spread by sexual contact. Wear cotton underwear. Also wear pantyhose with a cotton crotch. If you feel comfortable without it, skip wearing underwear to bed. Yeast thrives in [place] [employer] Your symptoms should improve in the next day or two.  GET HELP RIGHT AWAY IF:  You have pain in your lower abdomen ( pelvic area or over your ovaries) You develop nausea or vomiting You develop a fever Your discharge changes or worsens You have persistent pain with intercourse You develop shortness of breath, a rapid pulse, or you faint.  These symptoms could be signs of problems or infections that need to be evaluated by a medical provider now.  MAKE SURE YOU   Understand these instructions. Will watch your condition. Will get help right away if you are not doing  well or get worse.  Your e-visit answers were reviewed by a board certified advanced clinical practitioner to complete your personal care plan. Depending upon the condition, your plan could have included both over the counter or  prescription medications. Please review your pharmacy choice to make sure that you have choses a pharmacy that is open for you to pick up any needed prescription, Your safety is important to us . If you have drug allergies check your prescription carefully.   You can use MyChart to ask questions about today's visit, request a non-urgent call back, or ask for a work or school excuse for 24 hours related to this e-Visit. If it has been greater than 24 hours you will need to follow up with your provider, or enter a new e-Visit to address those concerns. You will get a MyChart message within the next two days asking about your experience. I hope that your e-visit has been valuable and will speed your recovery.  I have spent 5 minutes in review of e-visit questionnaire, review and updating patient chart, medical decision making and response to patient.   Lauraine Kitty, FNP

## 2024-04-16 ENCOUNTER — Telehealth: Admitting: Family Medicine

## 2024-04-16 DIAGNOSIS — M545 Low back pain, unspecified: Secondary | ICD-10-CM | POA: Diagnosis not present

## 2024-04-16 MED ORDER — NAPROXEN 500 MG PO TABS: 500.0000 mg | ORAL_TABLET | Freq: Two times a day (BID) | ORAL | 0 refills | Status: AC

## 2024-04-16 MED ORDER — BACLOFEN 10 MG PO TABS: 10.0000 mg | ORAL_TABLET | Freq: Three times a day (TID) | ORAL | 0 refills | Status: AC

## 2024-04-16 NOTE — Progress Notes (Signed)
 We are sorry that you are not feeling well.  Here is how we plan to help!  Based on what you have shared with me it, appears you may be experiencing acute back pain.   Acute back pain is defined as musculoskeletal pain that can resolve in 1-3 weeks with conservative treatment.  I have prescribed a non-steroid anti-inflammatory (NSAID) Naprosyn  500 mg -- take one by mouth twice a day, as well as a muscle relaxant, Baclofen  10 mg every eight hours as needed.  We do not send anything stronger than Baclofen  or Cyclobenzaprine , if you require something stronger, you will need to be seen in person by urgent care, PCP or the emergency room.   Some patients experience stomach irritation or in increased heartburn with anti-inflammatory drugs.  Please keep in mind that muscle relaxer's can cause fatigue and should not be taken while at work or driving.  Back pain is very common.  The pain often gets better over time.  The cause of back pain is usually not dangerous.  Most people can learn to manage their back pain on their own.  Home Care Stay active.  Start with short walks on flat ground if you can.  Try to walk farther each day. Do not sit, drive or stand in one place for more than 30 minutes.  Do not stay in bed. Do not fully avoid exercise or work.  Activity can help your back heal faster. Be careful when you bend or lift an object.  Bend at your knees, keep the object close to you, and do not twist. Sleep on a firm mattress.  Lie on your side, and bend your knees.  If you lie on your back, put a pillow under your knees. Only take medicines as told by your doctor. Put ice on the injured area. Put ice in a plastic bag Place a towel between your skin and the bag Leave the ice on for 15-20 minutes, 3-4 times a day for the first 2-3 days.  After that, you can switch between ice and heat packs. Ask your doctor about back exercises or massage.   Get Help Right Way If: Your pain does not go away with  rest and treatments given today. Your pain does not go away within 1 week. You have new problems. You do not feel well. The pain spreads into your legs. You cannot control when you poop (bowel movement) or pee (urinate). You feel sick to your stomach (nauseous) or throw up (vomit). You have belly (abdominal) pain. You feel like you may pass out (faint). If you develop a fever.  Make Sure you: Understand these instructions. Continue to monitor your condition for any changes. Will get help right away if you are not doing well or get worse.  Your e-visit answers were reviewed by a board certified advanced clinical practitioner to complete your personal care plan.  Depending on the condition, your plan could have included both over the counter or prescription medications.  If there is a problem, please reply once you have received a response from your provider.  Your safety is important to us .  If you have drug allergies, check your prescription carefully.    You can use MyChart to ask questions about todays visit, request a non-urgent call back, or ask for a work or school excuse for 24 hours related to this e-Visit. If it has been greater than 24 hours you will need to follow up with your provider or enter a  new e-Visit to address those concerns.  You will get an e-mail in the next two days asking about your experience.  I hope that your e-visit has been valuable and will speed your recovery. Thank you for using e-visits.   I have spent 5 minutes in review of e-visit questionnaire, review and updating patient chart, medical decision making and response to patient.   Roosvelt Mater, PA-C

## 2024-05-19 ENCOUNTER — Other Ambulatory Visit: Payer: Self-pay | Admitting: Family

## 2024-05-19 DIAGNOSIS — G4700 Insomnia, unspecified: Secondary | ICD-10-CM
